# Patient Record
Sex: Female | Born: 1945 | Race: White | Hispanic: No | Smoking: Never smoker
Health system: Southern US, Community
[De-identification: ages and names within clinical notes are randomized; demographics above are authoritative.]

## PROBLEM LIST (undated history)

## (undated) DIAGNOSIS — I10 Essential (primary) hypertension: Secondary | ICD-10-CM

## (undated) DIAGNOSIS — G4700 Insomnia, unspecified: Secondary | ICD-10-CM

## (undated) DIAGNOSIS — R253 Fasciculation: Secondary | ICD-10-CM

## (undated) DIAGNOSIS — E559 Vitamin D deficiency, unspecified: Secondary | ICD-10-CM

## (undated) DIAGNOSIS — M5430 Sciatica, unspecified side: Secondary | ICD-10-CM

## (undated) DIAGNOSIS — M8580 Other specified disorders of bone density and structure, unspecified site: Secondary | ICD-10-CM

## (undated) DIAGNOSIS — K589 Irritable bowel syndrome without diarrhea: Secondary | ICD-10-CM

## (undated) DIAGNOSIS — K760 Fatty (change of) liver, not elsewhere classified: Secondary | ICD-10-CM

## (undated) DIAGNOSIS — E785 Hyperlipidemia, unspecified: Secondary | ICD-10-CM

## (undated) DIAGNOSIS — F419 Anxiety disorder, unspecified: Secondary | ICD-10-CM

## (undated) HISTORY — PX: CYST EXCISION: SHX5701

## (undated) HISTORY — DX: Irritable bowel syndrome without diarrhea: K58.9

## (undated) HISTORY — DX: Other specified disorders of bone density and structure, unspecified site: M85.80

## (undated) HISTORY — DX: Insomnia, unspecified: G47.00

## (undated) HISTORY — DX: Fatty (change of) liver, not elsewhere classified: K76.0

## (undated) HISTORY — DX: Anxiety disorder, unspecified: F41.9

## (undated) HISTORY — DX: Sciatica, unspecified side: M54.30

## (undated) HISTORY — PX: ABDOMINAL HYSTERECTOMY: SHX81

## (undated) HISTORY — DX: Vitamin D deficiency, unspecified: E55.9

## (undated) HISTORY — DX: Hyperlipidemia, unspecified: E78.5

## (undated) HISTORY — PX: CHOLECYSTECTOMY: SHX55

## (undated) HISTORY — DX: Fasciculation: R25.3

---

## 2000-02-26 ENCOUNTER — Inpatient Hospital Stay (HOSPITAL_COMMUNITY): Admission: AD | Admit: 2000-02-26 | Discharge: 2000-02-27 | Payer: Self-pay | Admitting: Cardiology

## 2000-02-27 ENCOUNTER — Encounter: Payer: Self-pay | Admitting: Cardiology

## 2000-12-21 ENCOUNTER — Ambulatory Visit (HOSPITAL_COMMUNITY): Admission: RE | Admit: 2000-12-21 | Discharge: 2000-12-21 | Payer: Self-pay | Admitting: Internal Medicine

## 2001-01-04 ENCOUNTER — Ambulatory Visit (HOSPITAL_COMMUNITY): Admission: RE | Admit: 2001-01-04 | Discharge: 2001-01-04 | Payer: Self-pay | Admitting: Family Medicine

## 2001-01-04 ENCOUNTER — Encounter: Payer: Self-pay | Admitting: Family Medicine

## 2001-01-10 ENCOUNTER — Encounter: Payer: Self-pay | Admitting: Family Medicine

## 2001-01-10 ENCOUNTER — Ambulatory Visit (HOSPITAL_COMMUNITY): Admission: RE | Admit: 2001-01-10 | Discharge: 2001-01-10 | Payer: Self-pay | Admitting: Family Medicine

## 2001-03-18 ENCOUNTER — Other Ambulatory Visit: Admission: RE | Admit: 2001-03-18 | Discharge: 2001-03-18 | Payer: Self-pay | Admitting: Obstetrics and Gynecology

## 2002-01-04 ENCOUNTER — Encounter: Payer: Self-pay | Admitting: Family Medicine

## 2002-01-04 ENCOUNTER — Ambulatory Visit (HOSPITAL_COMMUNITY): Admission: RE | Admit: 2002-01-04 | Discharge: 2002-01-04 | Payer: Self-pay | Admitting: Family Medicine

## 2002-08-07 ENCOUNTER — Ambulatory Visit (HOSPITAL_COMMUNITY): Admission: RE | Admit: 2002-08-07 | Discharge: 2002-08-07 | Payer: Self-pay | Admitting: Family Medicine

## 2002-08-07 ENCOUNTER — Encounter: Payer: Self-pay | Admitting: Family Medicine

## 2002-11-01 ENCOUNTER — Other Ambulatory Visit: Admission: RE | Admit: 2002-11-01 | Discharge: 2002-11-01 | Payer: Self-pay | Admitting: Dermatology

## 2009-01-26 ENCOUNTER — Inpatient Hospital Stay (HOSPITAL_COMMUNITY): Admission: EM | Admit: 2009-01-26 | Discharge: 2009-01-31 | Payer: Self-pay | Admitting: Emergency Medicine

## 2009-08-13 ENCOUNTER — Ambulatory Visit (HOSPITAL_COMMUNITY): Admission: RE | Admit: 2009-08-13 | Discharge: 2009-08-13 | Payer: Self-pay | Admitting: Family Medicine

## 2009-08-22 ENCOUNTER — Ambulatory Visit: Payer: Self-pay | Admitting: Gastroenterology

## 2009-08-22 DIAGNOSIS — K649 Unspecified hemorrhoids: Secondary | ICD-10-CM | POA: Insufficient documentation

## 2009-08-22 DIAGNOSIS — K5730 Diverticulosis of large intestine without perforation or abscess without bleeding: Secondary | ICD-10-CM | POA: Insufficient documentation

## 2009-08-22 DIAGNOSIS — K5732 Diverticulitis of large intestine without perforation or abscess without bleeding: Secondary | ICD-10-CM | POA: Insufficient documentation

## 2009-08-22 DIAGNOSIS — D649 Anemia, unspecified: Secondary | ICD-10-CM | POA: Insufficient documentation

## 2009-09-02 ENCOUNTER — Telehealth (INDEPENDENT_AMBULATORY_CARE_PROVIDER_SITE_OTHER): Payer: Self-pay

## 2010-06-08 ENCOUNTER — Encounter: Payer: Self-pay | Admitting: Family Medicine

## 2010-06-19 NOTE — Assessment & Plan Note (Signed)
Summary: PT HAS A H/O DIVERTICULITIS,ANEMIA AND WANT CONSULT FOR TCS/CM   Visit Type:  Initial Visit Primary Care Provider:  Sudie Bailey  Chief Complaint:  Hx diverticulitis.  History of Present Illness: Claire Weaver is a pleasant 65 y/o WF, patient of Dr. Sudie Bailey, who presents for further evalution of diverticulitis. She says she has multiple mild episodes of left sided abdominal tenderness associated with altered BM throughout the years. She had TCS over 10 years ago with Dr. Karilyn Cota, no polyps. She takes fiber daily. Back in September she was hospitalized at [facility] for the first time due to uncomplicated diverticultitis. She had normal H/H on admission but Hgb dropped to mid 10 range with dilution. She has not had her Hgb checked since then.   She has regular BMs, ranging from firm to mushy. She has noted fecal soilage for two months. Denies melena, brbpr. C/O intermittent bloating and gas. Stress, spicey foods, tomotoes triggers her left sided abdominal pain. She c/o anorectal itching and burning related to hemorrhoids.          Current Medications (verified): 1)  Enalapril Maleate 10 Mg Tabs (Enalapril Maleate) .... Take 1 Tablet By Mouth Once A Day 2)  Januvia 100 Mg Tabs (Sitagliptin Phosphate) .... 1/4 Tablet Daily 3)  Asa 81 Mg .... Take 1 Tablet By Mouth Once A Day 4)  Zetia 10 Mg Tabs (Ezetimibe) .... Take 1 Tablet By Mouth Once A Day 5)  Calcium 500 Mg Plus D .... Take 1 Tablet By Mouth Once A Day 6)  Fish Oil 500 Mg .... Take 1 Tablet By Mouth Once A Day 7)  Aleve .... As Needed  Allergies (verified): 1)  ! Bactrim 2)  ! * Shell Fish  Past History:  Past Medical History: Sigmoid diverticulitis, hospitalized in 9/10 Normocytic anemia, during hospitalization 9/10, ?dilutional Diabetes mellitus  Hypertension.  Hyperlipidemia  Past Surgical History: Hysterectomy, partial  Cholecystectomy  Family History:  No history of colon cancer.  Positive history of   diverticulosis and colon polyps (mother).      Social History: Divorced, lives with her friend.  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  Denies any smoking or alcohol.  Review of Systems General:  Denies fever, chills, sweats, anorexia, fatigue, weakness, and weight loss. Eyes:  Denies vision loss. ENT:  Denies nasal congestion, hoarseness, and difficulty swallowing. CV:  Denies chest pains, angina, palpitations, dyspnea on exertion, and peripheral edema. Resp:  Denies dyspnea at rest, dyspnea with exercise, and cough. GI:  See HPI. GU:  Denies urinary burning and blood in urine. MS:  Complains of joint pain / LOM. Derm:  Denies rash and itching. Neuro:  Denies weakness, difficulty walking, memory loss, and confusion. Psych:  Denies depression and anxiety. Endo:  Denies unusual weight change. Heme:  Denies bruising and bleeding. Allergy:  Denies hives and rash.  Vital Signs:  Patient profile:   65 year old female Height:      65 inches Weight:      185 pounds BMI:     30.90 Temp:     98.7 degrees F oral Pulse rate:   80 / minute BP sitting:   132 / 78  (left arm) Cuff size:   large  Vitals Entered By: Claire Spring LPN (August 23, 979 2:13 PM)  Physical Exam  General:  Well developed, well nourished, no acute distress. Head:  Normocephalic and atraumatic. Eyes:  Conjunctivae pink, no scleral icterus.  Mouth:  Oropharyngeal mucosa moist, pink.  No lesions,  erythema or exudate.    Neck:  Supple; no masses or thyromegaly. Lungs:  Clear throughout to auscultation. Heart:  Regular rate and rhythm; no murmurs, rubs,  or bruits. Abdomen:  Soft. Normal bowel sounds. Mild left-sided abd tenderness without rebound or guarding. No HSM or masses. No abd bruit or hernia. Rectal:  deferred until time of colonoscopy.   Extremities:  No clubbing, cyanosis, edema or deformities noted. Neurologic:  Alert and  oriented x4;  grossly normal neurologically. Skin:  Intact without significant lesions or  rashes. Cervical Nodes:  No significant cervical adenopathy. Psych:  Alert and cooperative. Normal mood and affect.  Impression & Recommendations:  Problem # 1:  DIVERTICULOSIS OF COLON (ICD-562.10)  Hospitalized 9/10 with diverticulitis. Most "flares" she treated with change in diet and did not require Abx. She continues to have intermittent mild left-sided abdominal pain with soft/loose stool worse with certain foods and stress. ?underlying IBS too. Last TCS over ten yrs ago. FH of colon polyps. Colonoscopy to be performed in near future.  Risks, alternatives, and benefits including but not limited to the risk of reaction to medication, bleeding, infection, and perforation were addressed.  Patient voiced understanding and provided verbal consent. Hold ASA five days. Hold Januvia day of prep.   Orders: New Patient Level III (02725)  Problem # 2:  HEMORRHOIDS (ICD-455.6)  Patient c/o anorectal itching and fecal soilage. Would like to have hemorrhoids banded if possible. Has failed numberous topical regimens. Advised that Dr. Darrick Penna will examine at time of TCS and decide about options of banding.   Orders: New Patient Level III (36644)     Appended Document: PT HAS A H/O DIVERTICULITIS,ANEMIA AND WANT CONSULT FOR TCS/CM Pt needs 1 hour slot for TCS/? HEMORRHOIDAL BANDING.  Appended Document: PT HAS A H/O DIVERTICULITIS,ANEMIA AND WANT CONSULT FOR TCS/CM Pt is in a 1 hour slot.

## 2010-06-19 NOTE — Progress Notes (Signed)
Summary: phone note/ Miralax prep not started working  Phone Note Call from Patient   Caller: Patient Summary of Call: Pt said she started her Miralax prep @ 11:00 this AM and said it hasn't done anything yet. She wants to know if she needs to  take Mag Citrate or something else. She can be reached at [phone] her cell phone. Please advise Initial call taken by: Cloria Spring LPN,  September 02, 2009 2:45 PM     Appended Document: phone note/ Miralax prep not started working Please call pt. She should take 17 gm Miralax in 8 oz liquid every hour from 4p-12MN. She should add additional 8 oz liquid every half hour beginning 430p and end at 1130p.  Appended Document: phone note/ Miralax prep not started working [facility] at home and on cell.  Appended Document: phone note/ Miralax prep not started working Pt said she is hurting so bad, said she is having another flare with her diverticulitis, on cipro. Said to just cancel for tomorrow and she will talk to Dr. Darrick Penna, or call back later and reschedule. She just can't continue now. She can be reached at work at [phone].  Appended Document: phone note/ Miralax prep not started working Informed Kim and removed from IDX.

## 2010-06-19 NOTE — Letter (Signed)
Summary: TRCS ORDER  TRCS ORDER   Imported By: Ave Filter 08/22/2009 15:27:19  _____________________________________________________________________  External Attachment:    Type:   Image     Comment:   External Document

## 2010-07-04 ENCOUNTER — Observation Stay (HOSPITAL_COMMUNITY)
Admission: RE | Admit: 2010-07-04 | Discharge: 2010-07-05 | DRG: 313 | Disposition: A | Discharge status: Home / Self Care | Payer: Self-pay | Source: Other Acute Inpatient Hospital | Attending: Cardiovascular Disease | Admitting: Cardiovascular Disease

## 2010-07-04 ENCOUNTER — Emergency Department (HOSPITAL_COMMUNITY): Payer: Self-pay

## 2010-07-04 ENCOUNTER — Emergency Department (HOSPITAL_COMMUNITY)
Admission: EM | Admit: 2010-07-04 | Discharge: 2010-07-04 | Disposition: A | Discharge status: Other Acute Inpatient Hospital | Payer: Self-pay | Attending: Emergency Medicine | Admitting: Emergency Medicine

## 2010-07-04 DIAGNOSIS — E785 Hyperlipidemia, unspecified: Secondary | ICD-10-CM | POA: Insufficient documentation

## 2010-07-04 DIAGNOSIS — E119 Type 2 diabetes mellitus without complications: Secondary | ICD-10-CM | POA: Insufficient documentation

## 2010-07-04 DIAGNOSIS — R079 Chest pain, unspecified: Principal | ICD-10-CM | POA: Insufficient documentation

## 2010-07-04 DIAGNOSIS — I10 Essential (primary) hypertension: Secondary | ICD-10-CM | POA: Insufficient documentation

## 2010-07-04 DIAGNOSIS — Z7982 Long term (current) use of aspirin: Secondary | ICD-10-CM | POA: Insufficient documentation

## 2010-07-04 DIAGNOSIS — Z79899 Other long term (current) drug therapy: Secondary | ICD-10-CM | POA: Insufficient documentation

## 2010-07-04 LAB — DIFFERENTIAL
Basophils Absolute: 0 10*3/uL (ref 0.0–0.1)
Basophils Relative: 0 % (ref 0–1)
Eosinophils Relative: 1 % (ref 0–5)
Lymphocytes Relative: 31 % (ref 12–46)
Lymphs Abs: 1.4 10*3/uL (ref 0.7–4.0)
Monocytes Absolute: 0.4 10*3/uL (ref 0.1–1.0)
Monocytes Relative: 9 % (ref 3–12)
Neutro Abs: 2.8 10*3/uL (ref 1.7–7.7)

## 2010-07-04 LAB — COMPREHENSIVE METABOLIC PANEL
ALT: 22 U/L (ref 0–35)
Albumin: 3.7 g/dL (ref 3.5–5.2)
Alkaline Phosphatase: 60 U/L (ref 39–117)
BUN: 17 mg/dL (ref 6–23)
CO2: 26 mEq/L (ref 19–32)
Calcium: 9 mg/dL (ref 8.4–10.5)
Chloride: 106 mEq/L (ref 96–112)
Creatinine, Ser: 0.73 mg/dL (ref 0.4–1.2)
GFR calc Af Amer: >60 mL/min (ref >60)
GFR calc non Af Amer: >60 mL/min (ref >60)
Sodium: 141 mEq/L (ref 135–145)
Total Bilirubin: 0.5 mg/dL (ref 0.3–1.2)
Total Protein: 6.5 g/dL (ref 6.0–8.3)

## 2010-07-04 LAB — BASIC METABOLIC PANEL
BUN: 22 mg/dL (ref 6–23)
CO2: 24 mEq/L (ref 19–32)
Chloride: 104 mEq/L (ref 96–112)
Creatinine, Ser: 0.92 mg/dL (ref 0.4–1.2)
GFR calc Af Amer: >60 mL/min (ref >60)
Glucose, Bld: 188 mg/dL — ABNORMAL HIGH (ref 70–99)
Potassium: 3.8 mEq/L (ref 3.5–5.1)
Sodium: 139 mEq/L (ref 135–145)

## 2010-07-04 LAB — CBC
HCT: 38 % (ref 36.0–46.0)
Hemoglobin: 12.6 g/dL (ref 12.0–15.0)
MCH: 29 pg (ref 26.0–34.0)
MCHC: 33.2 g/dL (ref 30.0–36.0)
MCV: 87.4 fL (ref 78.0–100.0)
Platelets: 192 10*3/uL (ref 150–400)
RDW: 12.1 % (ref 11.5–15.5)
WBC: 4.7 10*3/uL (ref 4.0–10.5)

## 2010-07-04 LAB — POCT CARDIAC MARKERS
CKMB, poc: <1 ng/mL — ABNORMAL LOW (ref 1.0–8.0)
CKMB, poc: <1 ng/mL — ABNORMAL LOW (ref 1.0–8.0)
Myoglobin, poc: 68.4 ng/mL (ref 12–200)
Myoglobin, poc: 49.6 ng/mL (ref 12–200)
Troponin i, poc: <0.05 ng/mL (ref 0.00–0.09)
Troponin i, poc: <0.05 ng/mL (ref 0.00–0.09)

## 2010-07-04 LAB — CARDIAC PANEL(CRET KIN+CKTOT+MB+TROPI)
CK, MB: 1 ng/mL (ref 0.3–4.0)
Relative Index: INVALID (ref 0.0–2.5)
Total CK: 87 U/L (ref 7–177)
Troponin I: 0.01 ng/mL (ref 0.00–0.06)

## 2010-07-04 LAB — GLUCOSE, CAPILLARY

## 2010-07-04 LAB — LIPASE, BLOOD: Lipase: 22 U/L (ref 11–59)

## 2010-07-04 LAB — D-DIMER, QUANTITATIVE: D-Dimer, Quant: 0.23 ug/mL-FEU (ref 0.00–0.48)

## 2010-07-04 LAB — PROTIME-INR: INR: 0.93 (ref 0.00–1.49)

## 2010-07-05 ENCOUNTER — Inpatient Hospital Stay (HOSPITAL_COMMUNITY): Payer: Self-pay

## 2010-07-05 DIAGNOSIS — R079 Chest pain, unspecified: Secondary | ICD-10-CM

## 2010-07-05 LAB — CARDIAC PANEL(CRET KIN+CKTOT+MB+TROPI)
CK, MB: 0.9 ng/mL (ref 0.3–4.0)
Relative Index: INVALID (ref 0.0–2.5)
Total CK: 80 U/L (ref 7–177)
Troponin I: 0.01 ng/mL (ref 0.00–0.06)
Troponin I: 0.01 ng/mL (ref 0.00–0.06)

## 2010-07-05 LAB — GLUCOSE, CAPILLARY: Glucose-Capillary: 145 mg/dL — ABNORMAL HIGH (ref 70–99)

## 2010-07-05 LAB — TSH: TSH: 1.45 u[IU]/mL (ref 0.350–4.500)

## 2010-07-05 LAB — HEPATIC FUNCTION PANEL
ALT: 18 U/L (ref 0–35)
Albumin: 3.4 g/dL — ABNORMAL LOW (ref 3.5–5.2)
Alkaline Phosphatase: 54 U/L (ref 39–117)
Total Bilirubin: 0.3 mg/dL (ref 0.3–1.2)
Total Protein: 5.8 g/dL — ABNORMAL LOW (ref 6.0–8.3)

## 2010-07-05 LAB — LIPID PANEL
Cholesterol: 176 mg/dL (ref 0–200)
HDL: 33 mg/dL — ABNORMAL LOW (ref >39)
Total CHOL/HDL Ratio: 5.3 RATIO

## 2010-07-05 MED ORDER — TECHNETIUM TC 99M TETROFOSMIN IV KIT
30.0000 | PACK | Freq: Once | INTRAVENOUS | Status: AC | PRN
Start: 2010-07-05 — End: 2010-07-05
  Administered 2010-07-05: 30 via INTRAVENOUS

## 2010-07-05 MED ORDER — TECHNETIUM TC 99M TETROFOSMIN IV KIT
10.0000 | PACK | Freq: Once | INTRAVENOUS | Status: AC | PRN
  Administered 2010-07-05: 10 via INTRAVENOUS

## 2010-07-06 ENCOUNTER — Other Ambulatory Visit (HOSPITAL_COMMUNITY): Payer: Self-pay

## 2010-07-08 ENCOUNTER — Inpatient Hospital Stay (HOSPITAL_COMMUNITY)
Admission: AD | Admit: 2010-07-08 | Discharge: 2010-07-10 | DRG: 287 | Disposition: A | Discharge status: Home / Self Care | Payer: Self-pay | Source: Ambulatory Visit | Attending: Cardiology | Admitting: Cardiology

## 2010-07-08 DIAGNOSIS — E119 Type 2 diabetes mellitus without complications: Secondary | ICD-10-CM | POA: Diagnosis present

## 2010-07-08 DIAGNOSIS — Z881 Allergy status to other antibiotic agents status: Secondary | ICD-10-CM

## 2010-07-08 DIAGNOSIS — F411 Generalized anxiety disorder: Secondary | ICD-10-CM | POA: Diagnosis present

## 2010-07-08 DIAGNOSIS — R0789 Other chest pain: Principal | ICD-10-CM | POA: Diagnosis present

## 2010-07-08 DIAGNOSIS — Z882 Allergy status to sulfonamides status: Secondary | ICD-10-CM

## 2010-07-08 DIAGNOSIS — Z7982 Long term (current) use of aspirin: Secondary | ICD-10-CM

## 2010-07-08 DIAGNOSIS — I10 Essential (primary) hypertension: Secondary | ICD-10-CM | POA: Diagnosis present

## 2010-07-08 DIAGNOSIS — E785 Hyperlipidemia, unspecified: Secondary | ICD-10-CM | POA: Diagnosis present

## 2010-07-08 DIAGNOSIS — Z91013 Allergy to seafood: Secondary | ICD-10-CM

## 2010-07-08 DIAGNOSIS — R079 Chest pain, unspecified: Secondary | ICD-10-CM

## 2010-07-08 HISTORY — DX: Essential (primary) hypertension: I10

## 2010-07-08 LAB — COMPREHENSIVE METABOLIC PANEL
ALT: 19 U/L (ref 0–35)
AST: 26 U/L (ref 0–37)
Albumin: 4 g/dL (ref 3.5–5.2)
CO2: 25 mEq/L (ref 19–32)
Chloride: 99 mEq/L (ref 96–112)
GFR calc Af Amer: >60 mL/min (ref >60)
GFR calc non Af Amer: 53 mL/min — ABNORMAL LOW (ref >60)
Glucose, Bld: 175 mg/dL — ABNORMAL HIGH (ref 70–99)
Potassium: 3.7 mEq/L (ref 3.5–5.1)
Sodium: 136 mEq/L (ref 135–145)
Total Bilirubin: 0.5 mg/dL (ref 0.3–1.2)
Total Protein: 6.5 g/dL (ref 6.0–8.3)

## 2010-07-08 LAB — GLUCOSE, CAPILLARY
Glucose-Capillary: 177 mg/dL — ABNORMAL HIGH (ref 70–99)
Glucose-Capillary: 134 mg/dL — ABNORMAL HIGH (ref 70–99)

## 2010-07-08 LAB — CBC
HCT: 36.2 % (ref 36.0–46.0)
Hemoglobin: 12 g/dL (ref 12.0–15.0)
MCH: 29.9 pg (ref 26.0–34.0)
MCHC: 33.1 g/dL (ref 30.0–36.0)
MCV: 90 fL (ref 78.0–100.0)
Platelets: 210 10*3/uL (ref 150–400)
RBC: 4.02 MIL/uL (ref 3.87–5.11)
RDW: 12.2 % (ref 11.5–15.5)
WBC: 6.4 10*3/uL (ref 4.0–10.5)

## 2010-07-08 LAB — PROTIME-INR
INR: 0.97 (ref 0.00–1.49)
Prothrombin Time: 13.1 seconds (ref 11.6–15.2)

## 2010-07-08 LAB — DIFFERENTIAL
Basophils Absolute: 0 10*3/uL (ref 0.0–0.1)
Basophils Relative: 1 % (ref 0–1)
Eosinophils Absolute: 0 10*3/uL (ref 0.0–0.7)
Eosinophils Relative: 1 % (ref 0–5)
Lymphs Abs: 2.4 10*3/uL (ref 0.7–4.0)
Monocytes Absolute: 0.5 10*3/uL (ref 0.1–1.0)
Monocytes Relative: 8 % (ref 3–12)

## 2010-07-08 LAB — CARDIAC PANEL(CRET KIN+CKTOT+MB+TROPI)
CK, MB: 1 ng/mL (ref 0.3–4.0)
CK, MB: 1.1 ng/mL (ref 0.3–4.0)
Relative Index: INVALID (ref 0.0–2.5)
Total CK: 90 U/L (ref 7–177)
Troponin I: 0.04 ng/mL (ref 0.00–0.06)

## 2010-07-08 LAB — AMYLASE: Amylase: 37 U/L (ref 0–105)

## 2010-07-08 LAB — LIPASE, BLOOD: Lipase: 26 U/L (ref 11–59)

## 2010-07-09 ENCOUNTER — Encounter (HOSPITAL_COMMUNITY): Payer: Self-pay

## 2010-07-09 ENCOUNTER — Inpatient Hospital Stay (HOSPITAL_COMMUNITY): Payer: Self-pay

## 2010-07-09 LAB — CBC
HCT: 35.4 % — ABNORMAL LOW (ref 36.0–46.0)
Hemoglobin: 11.7 g/dL — ABNORMAL LOW (ref 12.0–15.0)
MCH: 29.8 pg (ref 26.0–34.0)
MCHC: 33.1 g/dL (ref 30.0–36.0)
MCV: 90.1 fL (ref 78.0–100.0)
Platelets: 185 10*3/uL (ref 150–400)
RBC: 3.93 MIL/uL (ref 3.87–5.11)
RDW: 12.3 % (ref 11.5–15.5)
WBC: 5.2 10*3/uL (ref 4.0–10.5)

## 2010-07-09 LAB — GLUCOSE, CAPILLARY
Glucose-Capillary: 144 mg/dL — ABNORMAL HIGH (ref 70–99)
Glucose-Capillary: 143 mg/dL — ABNORMAL HIGH (ref 70–99)
Glucose-Capillary: 151 mg/dL — ABNORMAL HIGH (ref 70–99)
Glucose-Capillary: 163 mg/dL — ABNORMAL HIGH (ref 70–99)
Glucose-Capillary: 161 mg/dL — ABNORMAL HIGH (ref 70–99)

## 2010-07-09 LAB — BASIC METABOLIC PANEL
BUN: 17 mg/dL (ref 6–23)
CO2: 26 mEq/L (ref 19–32)
Calcium: 8.6 mg/dL (ref 8.4–10.5)
Chloride: 105 mEq/L (ref 96–112)
GFR calc Af Amer: >60 mL/min (ref >60)
GFR calc non Af Amer: >60 mL/min (ref >60)
Glucose, Bld: 161 mg/dL — ABNORMAL HIGH (ref 70–99)
Potassium: 3.7 mEq/L (ref 3.5–5.1)
Sodium: 139 mEq/L (ref 135–145)

## 2010-07-09 LAB — HEPARIN LEVEL (UNFRACTIONATED)
Heparin Unfractionated: 0.1 IU/mL — ABNORMAL LOW (ref 0.30–0.70)
Heparin Unfractionated: 0.76 IU/mL — ABNORMAL HIGH (ref 0.30–0.70)

## 2010-07-09 LAB — CARDIAC PANEL(CRET KIN+CKTOT+MB+TROPI)
CK, MB: 1 ng/mL (ref 0.3–4.0)
Total CK: 82 U/L (ref 7–177)
Troponin I: 0.03 ng/mL (ref 0.00–0.06)

## 2010-07-09 MED ORDER — IOHEXOL 300 MG/ML  SOLN
80.0000 mL | Freq: Once | INTRAMUSCULAR | Status: AC | PRN
  Administered 2010-07-09: 80 mL via INTRAVENOUS

## 2010-07-10 LAB — CBC
HCT: 35.4 % — ABNORMAL LOW (ref 36.0–46.0)
Hemoglobin: 11.8 g/dL — ABNORMAL LOW (ref 12.0–15.0)
MCH: 29.9 pg (ref 26.0–34.0)
MCHC: 33.3 g/dL (ref 30.0–36.0)
MCV: 89.6 fL (ref 78.0–100.0)
Platelets: 190 10*3/uL (ref 150–400)
RDW: 12.2 % (ref 11.5–15.5)
WBC: 4.8 10*3/uL (ref 4.0–10.5)

## 2010-07-10 NOTE — Procedures (Signed)
  Claire Weaver, Claire Weaver               ACCOUNT NO.:  [id number]  MEDICAL RECORD NO.:  [id number]           PATIENT TYPE:  I  LOCATION:  6523                         FACILITY:  [facility]  PHYSICIAN:  Verne Carrow, MDDATE OF BIRTH:  02-14-1946  DATE OF PROCEDURE:  07/09/2010 DATE OF DISCHARGE:                           CARDIAC CATHETERIZATION   PRIMARY CARE PHYSICIAN:  Mila Homer. Sudie Bailey, MD  PRIMARY CARDIOLOGIST:  Gerrit Friends. Dietrich Pates, MD, [facility]  PROCEDURES PERFORMED: 1. Left heart catheterization. 2. Selective coronary angiography. 3. Left ventricular angiogram.  OPERATOR:  Verne Carrow, MD  INDICATIONS:  Chest pain.  PROCEDURE IN DETAIL:  The patient was brought to the cardiac catheterization laboratory after signing informed consent for the procedure.  The patient was placed supine on the cath table.  The right wrist was assessed with an Freida Busman test which was positive.  The right wrist was prepped and draped in sterile fashion.  Lidocaine 1% was used for local anesthesia.  A 5-French sheath was inserted into the right radial artery without difficulty.  Verapamil 3 mg was given through the sheath.  The patient was given 4000 units of heparin.  Standard diagnostic catheters were used to perform selective coronary angiography.  A pigtail catheter was used to perform a left ventricular angiogram.  The patient tolerated the procedure well.  The sheath was removed from the right radial artery and a Terumo hemostasis band was applied over the arteriotomy site.  The patient was taken to the holding area in stable condition.  HEMODYNAMIC FINDINGS:  Central aortic pressure 126/66.  Left ventricular pressure 121/11.  Left ventricular end-diastolic pressure 17.  ANGIOGRAPHIC FINDINGS:  The left main coronary artery bifurcated into the circumflex and the left anterior descending arteries that were free of disease.  The right coronary artery was a large dominant vessel  that was free of disease.  The left ventricular angiogram showed normal left ventricular systolic function with ejection fraction of 55%.  IMPRESSION: 1. No angiographic evidence of coronary artery disease. 2. Normal left ventricular systolic function. 3. Noncardiac chest pain.  RECOMMENDATIONS:  No further ischemic workup at this time.     Verne Carrow, MD     CM/MEDQ  D:  07/09/2010  T:  07/09/2010  Job:  [id number]  cc:   Mila Homer. Sudie Bailey, M.D. Gerrit Friends. Dietrich Pates, MD, [facility]  Electronically Signed by Verne Carrow MD on 07/10/2010 02:16:30 PM

## 2010-07-11 NOTE — H&P (Addendum)
Claire Weaver, Claire Weaver               ACCOUNT NO.:  [id number]  MEDICAL RECORD NO.:  [id number]           PATIENT TYPE:  I  LOCATION:  2012                         FACILITY:  [facility]  PHYSICIAN:  Madolyn Frieze. Jens Som, MD, FACCDATE OF BIRTH:  1945-08-17  DATE OF ADMISSION:  07/08/2010 DATE OF DISCHARGE:                             HISTORY & PHYSICAL   PRIMARY CARDIOLOGIST:  The patient was new to [place], her admission on July 04, 2010 through July 05, 2010, will follow up with Dr. Dietrich Pates.  PRIMARY MEDICAL DOCTOR:  Mila Homer. Sudie Bailey, MD  CHIEF COMPLAINT:  Chest pain.  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  She has a history of hypertension, hyperlipidemia and controlled diabetes.  During that hospital stay, she had a negative D- dimer, negative cardiac enzymes, and negative nuclear Myoview study as well as negative lipase and normal LFTs.  X-ray of the abdomen and chest were negative as well.  She was readmitted with complaints of chest pain.  Last night, she developed some chest pressure around 1 a.m. that kept her awake.  It was the same pain she has experienced before possibly associated with some neck discomfort.  The pain went straight to her back and has not gone away since.  She went to work today to try to get her mind off it.  She has been mentioned the pain to Dr. Sudie Bailey and to do its persistence, she was advised for further evaluation.  She has a direct admission to [facility] today.  She denies any dizziness or palpitations whatsoever.  With her episode prompting admission on July 04, 2010, she had had nausea, vomiting, presyncope and has decreased blood pressure, but has had  none of those associated symptoms with this particular episode.  EKG is unremarkable with no  acute distress.  PAST MEDICAL HISTORY: 1. Type 2 diabetes, controlled, non-insulin-dependent, A1c of 6.2%. 2. Controlled hypertension. 3. Dyslipidemia with total cholesterol 176, triglycerides 333, HDL 33,     LDL 76 on July 05, 2010. 4. Anxiety. 5. Diverticulitis. 6. Chest pain, with evaluation on July 04, 2010 to July 05, 2010 with negative cardiac enzymes, negative D-dimer, negative     Myoview stress test with normal EF, negative lipase.  LFTs are     within normal limits, and negative plain films of the abdomen and     chest.  PAST SURGICAL HISTORY:  Cholecystectomy and partial hysterectomy.  MEDICATIONS: 1. Aspirin 81 mg daily. 2. Enalapril 20 mg daily. 3. Biotin. 4. Over-the-counter hydrochlorothiazide 25 mg daily. 5. Metformin 500 mg daily. 6. Omega-3 1 g daily. 7. Xanax 1 mg one tablet every morning, afternoon and half tablet     nightly.  ALLERGIES:  BACTRIM, SHELLFISH and SULFA, although the patient has tolerated contrast dye in the past without difficulty or premedication.  SOCIAL HISTORY:  Ms. Gellerman lives in [place].  She denies any history of tobacco, alcohol, or drug use.  She has  had increased stress with her son lately.  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  FAMILY HISTORY:  Her brother had a CAD diagnosis in mid to late 5s.  REVIEW OF SYSTEMS:  No fevers, chills, cough, nausea, vomiting, diarrhea, dysphagia, bright red blood per rectum, melena, hematemesis or hematuria.  All other systems reviewed and otherwise negative except for those noted in the HPI.  LABORATORY DATA:  No labs this admission just yet, but TSH, lipase, LFTs and cardiac enzymes are within normal limits last admission.  RADIOLOGY:  None yet.  EKG:  Normal sinus rhythm with LVH.  PHYSICAL EXAMINATION:  VITAL SIGNS:  Temperature 98, pulse 80, respirations 20, blood pressure 136/80, pulse ox is 96% on room air. GENERAL:  This is a pleasant  well-appearing white female in no acute distress. HEENT:  Normocephalic and atraumatic with extraocular movements intact. Clear sclerae.  Nares without discharge. NECK:  Supple without carotid bruits. HEART:  Auscultation of the heart reveals regular rate and rhythm with S1 and S2 without murmurs, rubs, or gallops. LUNGS:  Clear to auscultation bilaterally without wheezes, rales or rhonchi. ABDOMEN:  Soft, nontender, and nondistended with positive bowel sounds. EXTREMITIES:  Warm and dry without edema.  She has 2+ pedal pulses bilaterally. NEUROLOGIC:  She is alert and oriented x3, responds to questions appropriately with a normal affect, but slightly worried appearing.  ASSESSMENT/PLAN:  The patient was seen and examined by Dr. Jens Som and myself.  This is a 65 year old female with a past medical history of includes diabetes, hypertension, hyperlipidemia who was admitted for recurrence of chest pain following admission that included negative stress test, LFTs, lipase, D-dimer and cardiac enzymes.  The patient had more chest pain today starting approximately 1 o'clock in the morning radiating to her back.  There have been no associated symptoms and the pain is not exertional.  Some question of whether or not has increased with inspiration.  The pain persisted and the physician in our office Dr. Sudie Bailey discussed with the case with Dr. Myrtis Ser.  The patient was referred for catheterization to define her anatomy.  Risks versus benefits were discussed with the patient who agrees to proceed.  There is initial consideration of aortic dissection, but given the chronicity of these symptoms over 9 months which makes it much less likely.  Note that the patient has history of shellfish allergy, but she has tolerated contrast dye without reaction in the past.  Further recommendations to follow depending on the patient's workup.     Marquetta Weiskopf, P.A.C.   ______________________________ Madolyn Frieze. Jens Som, MD, [facility]    DD/MEDQ  D:  07/08/2010  T:  07/09/2010  Job:  [id number]  cc:   Mila Homer. Sudie Bailey, M.D. Gerrit Friends. Dietrich Pates, MD, [facility]  Electronically Signed by Olga Millers MD [facility] on 07/11/2010 06:52:38 PM Electronically Signed by Ronie Spies  on 07/16/2010 12:57:32 PM

## 2010-07-15 NOTE — Discharge Summary (Signed)
Claire Weaver, Claire Weaver NO.:  [id number]  MEDICAL RECORD NO.:  [id number]           PATIENT TYPE:  I  LOCATION:  6523                         FACILITY:  [facility]  PHYSICIAN:  Luis Abed, MD, FACCDATE OF BIRTH:  01-21-1946  DATE OF ADMISSION:  07/08/2010 DATE OF DISCHARGE:  07/10/2010                              DISCHARGE SUMMARY   PRIMARY CARE PHYSICIAN:  Mila Homer. Sudie Bailey, MD  New to Cardiology.  DISCHARGE DIAGNOSIS:  Noncardiac chest pain. A.  Cardiac catheterization on July 09, 2010:  No angiographic evidence of coronary artery disease.  Normal left ventricular systolic function, left ventricular ejection fraction 55%. B.  CT angiography of the chest on July 09, 2010:  No evidence of acute aortic pathology or pulmonary thromboembolism. C.  Negative pharmacologic stress test on July 05, 2010.  SECONDARY DIAGNOSES: 1. Non-insulin-dependent diabetes mellitus (hemoglobin A1c 6.2%). 2. Hypertension, well controlled. 3. Dyslipidemia.     a.     Total cholesterol 176, triglycerides 333, HDL 33, LDL 76,      July 05, 2010. 4. Anxiety. 5. Diverticulitis.  PAST SURGICAL HISTORY: 1. Cholecystectomy. 2. Partial hysterectomy.  ALLERGIES AND INTOLERANCES: 1. SULFA (hives). 2. BACTRIM (hives).  PROCEDURES: 1. EKG on July 08, 2010:  NSR, LVH, no significant ST-T-wave     changes. 2. Cardiac catheterization on July 09, 2010:  Please see discharge     diagnoses section #1, subsection A. 3. CT angiography of the chest on July 09, 2010:  Please see     discharge diagnoses #1, subsection B.  HISTORY OF PRESENT ILLNESS:  Claire Weaver is a 65 year old female with the above-noted complex medical history who recently was worked up at [facility] after she developed an episode of severe nausea with presyncope while at work/at rest in the setting of recent exertional chest discomfort, mild.  She was admitted on July 04, 2010,  and eventually had a pharmacologic stress test that was negative as well as ruled out with serial negative cardiac enzymes.  Unfortunately, the patient had recurrence in chest discomfort at this time at approximately 1 a.m. and presented to [facility] on July 08, 2010, for further evaluation.  This patient reports her symptoms keeping her awake for a good portion in the evening prior to her presentation.  This was the same type of pain she had experienced before with radiation into the neck.  It also radiated into her back.  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  When evaluated by Cardiology, she denied palpitations, presyncope, lightheadedness/dizziness.  Her previous symptoms of nausea, vomiting, presyncope, and hypotension were absent from this episode.  HOSPITAL COURSE:  The patient was admitted and cardiac enzymes were cycled again ruling out for MI.  Given the recurrence of her symptoms, she was scheduled for diagnostic cardiac catheterization which was completed on the morning of July 09, 2010, and luckily did not show any angiographic evidence of coronary artery  disease.  She also had normal left ventricular systolic function.  She then had a CT angiography of the chest to rule out aortic dissection which again luckily was negative.  She was kept overnight for observation and then seen in the morning by attending cardiologist, Dr. Willa Rough.  He notes that CT did show hepatic steatosis but no other significant findings and thus given the significant symptoms was stable for discharge, okay to return to work on July 11, 2010.  The patient will follow up with Dr. John Giovanni in [place].  No changes were made to her preadmission medications.  At the time of her discharge,  the patient did receive her old med list unchanged, followup instructions, and postcath instructions.  All questions and concerns were addressed prior to her leaving the hospital.  DISCHARGE LABORATORY DATA:  WBC 4.8, HGB 11.8 HCT 35.4, PLT count is 190,000.  WBC differential on admission was within normal limits. Protime 13.1, INR 0.97.  Sodium 139, potassium 2.7, chloride 105, bicarb 26, BUN 17, creatinine 0.80, glucose ranged from 134-181 this admission. Liver function tests were within normal limits.  Albumin is 4.0, calcium 8.6, amylase 37, lipase 26.  Cardiac enzymes, three full sets, negative.  FOLLOWUP PLANS AND APPOINTMENTS:  Please see hospital course.  DISCHARGE MEDICATIONS: 1. Acetaminophen 325 mg 1-2 tablets p.o. q.4 h. 2. Enteric-coated aspirin 81 mg p.o. daily. 3. Biotin over-the-counter tablet p.o. daily. 4. Enalapril 20 mg 1 tablet daily. 5. Hydrochlorothiazide 25 mg 1 tablet p.o. daily. 6. Metformin 500 mg 1 tablet p.o. daily. 7. Omega-3 acid ethyl esters 1 g p.o. daily. 8. Xanax 1 mg one-half to 1 tablet p.o. t.i.d. 9. Nitrostat 0.4 mg one tablet sublingual q.5 minutes up to 2 doses     p.r.n. for chest discomfort.  Duration of discharge encounter including physician time was 35 minutes.     Jarrett Ables, PAC   ______________________________ Luis Abed, MD, [facility]    MS/MEDQ  D:  07/10/2010  T:  07/10/2010  Job:  [id number]  cc:   Mila Homer. Sudie Bailey, M.D.  Electronically Signed by Jarrett Ables PAC on 07/14/2010 02:55:57 PM Electronically Signed by Willa Rough MD FACC on 07/15/2010 08:38:04 PM

## 2010-07-21 ENCOUNTER — Encounter: Payer: Self-pay | Admitting: Adult Health

## 2010-08-06 NOTE — Discharge Summary (Signed)
  [name], [name]               ACCOUNT NO.:  [id number]  MEDICAL RECORD NO.:  [id number]           PATIENT TYPE:  I  LOCATION:  2035                         FACILITY:  [facility]  PHYSICIAN:  Gerrit Friends. Dietrich Pates, MD, FACCDATE OF BIRTH:  Oct 29, 1945  DATE OF ADMISSION:  07/04/2010 DATE OF DISCHARGE:  07/05/2010                              DISCHARGE SUMMARY   ADDENDUM  At the time of discharge, the patient requested continuation of Ambien 2 mg at bedtime p.r.n.  I agreed to give her a short course of this and  wrote a prescription, with no refills, and with recommendation that she subsequently follow with Dr. [name], for future management and adjustment of this medication.     Gene Serpe, PA-C   ______________________________ Gerrit Friends. Dietrich Pates, MD, [facility]    GS/MEDQ  D:  07/05/2010  T:  07/05/2010  Job:  [id number]  Electronically Signed by Rozell Searing PA-C on 07/16/2010 05:31:25 PM Electronically Signed by [place] Bing MD [facility] on 08/06/2010 06:40:51 PM

## 2010-08-06 NOTE — Discharge Summary (Signed)
  NAMECHADE, PITNER               ACCOUNT NO.:  [id number]  MEDICAL RECORD NO.:  [id number]           PATIENT TYPE:  I  LOCATION:  2035                         FACILITY:  [facility]  PHYSICIAN:  Gerrit Friends. Dietrich Pates, MD, FACCDATE OF BIRTH:  Sep 10, 1945  DATE OF ADMISSION:  07/04/2010 DATE OF DISCHARGE:  07/05/2010                              DISCHARGE SUMMARY   PRIMARY CARDIOLOGIST:  Gerrit Friends. Dietrich Pates, MD, [facility] (new)  DISCHARGE DIAGNOSIS: 1. Chest pain.     a.     Normal cardiac markers.     b.     Negative Lexiscan Myoview; ejection fraction 64%, this      admission.  SECONDARY DIAGNOSES: 1. Type 2 diabetes mellitus. 2. Hypertension. 3. Dyslipidemia.  REASON FOR ADMISSION:  [name] is a 65 year old female, with no known history of coronary artery disease, who presented with complaint of progressive exertional chest discomfort.  HOSPITAL COURSE:  Serial cardiac markers were drawn, and all within normal limits.  Given this, she was cleared to proceed with an in-house evaluation with a pharmacologic nuclear imaging study, for risk stratification.  The patient reported no chest pain during the procedure, nor developed any significant EKG changes.  Subsequent review of perfusion imaging revealed no perfusion defects; calculated ejection fraction 64%.  The patient was cleared for discharge later that same day.  Arrangements will be made for her to establish with Dr. Dietrich Pates, in our [facility].  The patient will resume all previous home medications, with the addition of p.r.n. nitroglycerin.  DISCHARGE LABORATORY DATA:  CPK 80/0.9, troponin I 0.01.  Total cholesterol 176, triglyceride 333, HDL 33, and LDL 76.  OUTSTANDING LABORATORY DATA:  Serial cardiac markers normal.  TSH 1.45. Liver function tests normal.  Potassium 3.9, BUN 17, creatinine 0.7 on admission.  CBC normal on admission.  Admission chest x-ray:  No acute disease.  DISPOSITION:   Stable.  FOLLOWUP:  Dr. Coupland Bing in 2 weeks, in our [facility]. Arrangements to be made through our office.  DISCHARGE MEDICATIONS: 1. Aspirin 81 mg daily. 2. Enalapril 20 mg daily. 3. Hydrochlorothiazide 25 mg daily. 4. Metformin 500 mg daily. 5. Xanax 0.5-1 mg t.i.d. 6. Nitroglycerin p.r.n.  DURATION OF DISCHARGE ENCOUNTER:  Greater 30 minutes, including physician time.     Gene Serpe, PA-C   ______________________________ Gerrit Friends. Dietrich Pates, MD, [facility]    GS/MEDQ  D:  07/05/2010  T:  07/06/2010  Job:  [id number]  cc:   Mila Homer. Sudie Bailey, M.D.  Electronically Signed by Rozell Searing PA-C on 07/08/2010 11:01:28 AM Electronically Signed by [place] Bing MD [facility] on 08/06/2010 06:40:47 PM

## 2010-08-06 NOTE — H&P (Signed)
[name], [name]               ACCOUNT NO.:  [id number]  MEDICAL RECORD NO.:  [id number]           PATIENT TYPE:  E  LOCATION:  [facility]                          FACILITY:  [facility]  PHYSICIAN:  Gerrit Friends. Dietrich Pates, MD, FACCDATE OF BIRTH:  1945/05/21  DATE OF ADMISSION:  07/04/2010 DATE OF DISCHARGE:  02/17/2012LH                             HISTORY & PHYSICAL   PRIMARY CARE PHYSICIAN:  Mila Homer. Sudie Bailey, MD  CHIEF COMPLAINT:  Chest discomfort.  HISTORY OF PRESENT ILLNESS:  [name] is a 65 year old Caucasian female with no known history of coronary artery disease having had a negative Myoview in 2001 and no reason for recurrent study until this presentation but with significant history for all CAD equivalent of non- insulin-dependent diabetes mellitus (well controlled with HbA1c of 6.2%), risk factors of hypertension (well controlled), hyperlipidemia, and positive family history who presents with increasing frequency of mild exertional chest discomfort in the setting of significant increased stress and a complex set of symptoms that occurred today at rest as outlined below.  Claire Weaver reports noticing some mild chest discomfort after finishing vacuuming that will resolve with rest, but often return if she continues to her most exerting activity which is cleaning the house.  She often has no awareness of her symptoms.  However, until she stopped to take a break.  She does not have significant dyspnea or any other complaints with this.  She has had some palpitations in the past that are not associated with exertion.  These episodes have not been associated with presyncope or frank syncope or any other significant symptoms are usually short duration, lasting only a couple of minutes.  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  She also had some mild chest discomfort but this  was not significant compared to her nausea.  She got up and went to the bathroom because she was concerned she might vomit.  She felt like "I might explode."  However, she did not vomit.  After a couple of minutes, she felt like it was safe to go back to her desk.  When she got back to her desk, however, she had worsening of her chest discomfort radiating down into her abdomen and then presyncope and continued mild nausea.  She put her head on the desk and explained how poorly she felt to a coworker.  Her blood pressure was then checked which she says was in the 70s systolic and her appearance was ashen at which point the physician she works for insisted she present to [facility] for further evaluation.  At [facility], she was given no medications but her symptoms resolved after about 10-15 minutes.  Vital signs with very mild hypertension, peak systolic at 143, otherwise within normal limits and stable.  Chest x-ray with no acute disease.  EKG with mild T-wave inversion in V1 only.  No other significant changes.  Labs unremarkable including two sets of point-of-care markers were negative and a D-dimer that was within normal limits.  She currently has no  symptoms and other than her concern about what happened earlier would feel fine to home.  PAST MEDICAL HISTORY: 1. NIDDM (HbA1c 6.2%). 2. Anxiety. 3. Hypertension (well controlled). 4. Hyperlipidemia.  SOCIAL HISTORY:  The patient lives in [place] with her family.  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  She has no history of tobacco abuse.  No EtOH use.  No illicit drug use.  No herbal meds.  Regular diet.  No regular exercise.  FAMILY HISTORY:  The patient has a younger brother who was diagnosed with coronary artery disease in his mid to late 54s, otherwise negative for premature diagnosis of coronary artery disease.  REVIEW OF SYSTEMS:  Please see HPI.  All other systems were reviewed and were negative  except for noted increased stress recently related to difficulties with her son.  CODE STATUS:  Full.  ALLERGIES:  NKDA.  MEDICATIONS: 1. Enteric-coated aspirin 81 mg p.o. daily. 2. Metformin. 3. Xanax.  REVIEW OF SYSTEMS:  The patient also reports increasing frequency of headaches recently, that can be moderate-to-severe lasting up to over an hour associated with left posterior neck discomfort, when her headaches resolve the left posterior neck discomfort also tends to resolve quickly.  PHYSICAL EXAMINATION:  VITAL SIGNS:  Temperature 98.2 degrees Fahrenheit, BP 123-143/66-70 with a pulse in the 70s, respirations 18- 20, and O2 saturation 98% on 2 L by nasal cannula. GENERAL:  The patient is alert and oriented x3 in no apparent stress, able to speak easily in full sentences without respiratory distress. HEENT:  Head is normocephalic and atraumatic.  Pupils equal, round, reactive to light.  Extraocular muscles are intact.  There is pain without discharge.  Dentition is fair.  Oropharynx without erythema or exudate. NECK:  Supple without lymphadenopathy.  No thyromegaly.  No bruit. HEART:  Rate is regular with audible S1 and S2.  No clicks, rubs, murmurs, or gallops.  Pulses are 2+ and equal in both upper and extremities bilaterally. LUNGS:  Clear to auscultation bilaterally. SKIN:  No rashes, lesions, or petechiae. ABDOMEN:  Soft, nontender, nondistended.  Normal abdominal bowel sounds. No rebound or guarding.  No hepatosplenomegaly.  EXTREMITIES:  Without clubbing, cyanosis, or edema. MUSCULOSKELETAL:  Without joint deformity or effusions.  No spinal or CVA tenderness. NEURO:  Cranial nerves II through XII grossly intact with strength 5/5 in all extremities and axial groups normal, sensation throughout, and normal cerebellar function.  RADIOLOGY:  Chest x-ray showed no acute cardiopulmonary disease.  EKG:  Rhythm NSR with RSR prime pattern, minimal T-wave inversion in  V1, nonspecific changes, otherwise no significant Q-waves, normal axis, no evidence of hypertrophy.  PR 152, QRS 92, and QTC of 442 except for the mild T-wave inversion in V1.  No significant changes from prior tracing completed in September 2010.  LABORATORY DATA:  WBC is 4.7, HGB 12.6, HCT 38.0, PLT count 192.  Sodium 139, potassium 3.8, chloride 104, bicarb 24, BUN 22, creatinine 0.92, glucose 188, D-dimer 0.23.  Point-of-care markers negative x2.  ASSESSMENT AND PLAN:  The patient initially seen by Lenard Galloway, PAC, and then seen and thoroughly assessed by attending cardiologist, Dr. [place] Bing.  Ms. Surratt is a 65 year old Caucasian female with no known history of coronary artery disease, negative Myoview in 2001, CAD equivalent of well controlled diabetes mellitus (not on insulin), risk factors of well- controlled hypertension, hyperlipidemia, and positive family history as well as history significant for anxiety who presents with an overall atypical symptoms for cardiac etiology with thus far  no objective evidence, but moderately high pretest probability.  Chest pain - The patient reports a 63-month history of exertional chest discomfort today with severe nausea and epigastric discomfort with some lower chest discomfort as well and what appeared to be vagal symptoms.  EKG is negative during discomfort.  Point-of-care markers negative x2.  Attending request transferred to [facility]. Plan will be to check stools, amylase, lipase, and empiric treatment with proton pump inhibitor and continued home medications, will also rule out with serial cardiac enzymes and likely stress Myoview in the morning.     Jarrett Ables, PAC   ______________________________ Gerrit Friends. Dietrich Pates, MD, [facility]    MS/MEDQ  D:  07/04/2010  T:  07/04/2010  Job:  [id number]  Electronically Signed by Jarrett Ables PAC on 07/08/2010 01:13:53 PM Electronically Signed by [place] Bing  MD [facility] on 08/06/2010 06:41:05 PM

## 2010-08-22 LAB — CARDIAC PANEL(CRET KIN+CKTOT+MB+TROPI)
CK, MB: 0.7 ng/mL (ref 0.3–4.0)
CK, MB: 0.8 ng/mL (ref 0.3–4.0)
CK, MB: 0.9 ng/mL (ref 0.3–4.0)
Relative Index: INVALID (ref 0.0–2.5)
Relative Index: INVALID (ref 0.0–2.5)
Total CK: 80 U/L (ref 7–177)
Troponin I: 0.02 ng/mL (ref 0.00–0.06)
Troponin I: 0.02 ng/mL (ref 0.00–0.06)

## 2010-08-22 LAB — URINALYSIS, ROUTINE W REFLEX MICROSCOPIC
Bilirubin Urine: NEGATIVE
Glucose, UA: NEGATIVE mg/dL
Hgb urine dipstick: NEGATIVE
Ketones, ur: NEGATIVE mg/dL
Nitrite: NEGATIVE
Protein, ur: NEGATIVE mg/dL
Specific Gravity, Urine: 1.027 (ref 1.005–1.030)
Urobilinogen, UA: 0.2 mg/dL (ref 0.0–1.0)
pH: 5.5 (ref 5.0–8.0)

## 2010-08-22 LAB — CBC
HCT: 29.6 % — ABNORMAL LOW (ref 36.0–46.0)
HCT: 31.2 % — ABNORMAL LOW (ref 36.0–46.0)
HCT: 33.2 % — ABNORMAL LOW (ref 36.0–46.0)
HCT: 36.4 % (ref 36.0–46.0)
Hemoglobin: 10.8 g/dL — ABNORMAL LOW (ref 12.0–15.0)
Hemoglobin: 10 g/dL — ABNORMAL LOW (ref 12.0–15.0)
Hemoglobin: 10.7 g/dL — ABNORMAL LOW (ref 12.0–15.0)
Hemoglobin: 12.5 g/dL (ref 12.0–15.0)
MCHC: 35.2 g/dL (ref 30.0–36.0)
MCHC: 33.7 g/dL (ref 30.0–36.0)
MCHC: 34.3 g/dL (ref 30.0–36.0)
MCHC: 34.5 g/dL (ref 30.0–36.0)
MCHC: 34.7 g/dL (ref 30.0–36.0)
MCV: 88.3 fL (ref 78.0–100.0)
MCV: 89.9 fL (ref 78.0–100.0)
MCV: 90.4 fL (ref 78.0–100.0)
MCV: 91.6 fL (ref 78.0–100.0)
Platelets: 181 10*3/uL (ref 150–400)
Platelets: 155 10*3/uL (ref 150–400)
Platelets: 169 10*3/uL (ref 150–400)
Platelets: 177 K/uL (ref 150–400)
Platelets: 178 10*3/uL (ref 150–400)
RBC: 3.46 MIL/uL — ABNORMAL LOW (ref 3.87–5.11)
RBC: 3.41 MIL/uL — ABNORMAL LOW (ref 3.87–5.11)
RBC: 3.68 MIL/uL — ABNORMAL LOW (ref 3.87–5.11)
RBC: 4.05 MIL/uL (ref 3.87–5.11)
RDW: 12.5 % (ref 11.5–15.5)
RDW: 12.8 % (ref 11.5–15.5)
RDW: 12.9 % (ref 11.5–15.5)
RDW: 12.9 % (ref 11.5–15.5)
WBC: 4.3 10*3/uL (ref 4.0–10.5)
WBC: 13.1 10*3/uL — ABNORMAL HIGH (ref 4.0–10.5)
WBC: 13.2 10*3/uL — ABNORMAL HIGH (ref 4.0–10.5)
WBC: 4.8 K/uL (ref 4.0–10.5)
WBC: 8.4 10*3/uL (ref 4.0–10.5)

## 2010-08-22 LAB — URINE CULTURE: Colony Count: 15000

## 2010-08-22 LAB — GLUCOSE, CAPILLARY
Glucose-Capillary: 126 mg/dL — ABNORMAL HIGH (ref 70–99)
Glucose-Capillary: 139 mg/dL — ABNORMAL HIGH (ref 70–99)
Glucose-Capillary: 103 mg/dL — ABNORMAL HIGH (ref 70–99)
Glucose-Capillary: 103 mg/dL — ABNORMAL HIGH (ref 70–99)
Glucose-Capillary: 110 mg/dL — ABNORMAL HIGH (ref 70–99)
Glucose-Capillary: 110 mg/dL — ABNORMAL HIGH (ref 70–99)
Glucose-Capillary: 116 mg/dL — ABNORMAL HIGH (ref 70–99)
Glucose-Capillary: 117 mg/dL — ABNORMAL HIGH (ref 70–99)
Glucose-Capillary: 118 mg/dL — ABNORMAL HIGH (ref 70–99)
Glucose-Capillary: 119 mg/dL — ABNORMAL HIGH (ref 70–99)
Glucose-Capillary: 120 mg/dL — ABNORMAL HIGH (ref 70–99)
Glucose-Capillary: 121 mg/dL — ABNORMAL HIGH (ref 70–99)
Glucose-Capillary: 122 mg/dL — ABNORMAL HIGH (ref 70–99)
Glucose-Capillary: 124 mg/dL — ABNORMAL HIGH (ref 70–99)
Glucose-Capillary: 131 mg/dL — ABNORMAL HIGH (ref 70–99)
Glucose-Capillary: 138 mg/dL — ABNORMAL HIGH (ref 70–99)
Glucose-Capillary: 147 mg/dL — ABNORMAL HIGH (ref 70–99)
Glucose-Capillary: 155 mg/dL — ABNORMAL HIGH (ref 70–99)
Glucose-Capillary: 163 mg/dL — ABNORMAL HIGH (ref 70–99)
Glucose-Capillary: 166 mg/dL — ABNORMAL HIGH (ref 70–99)
Glucose-Capillary: 180 mg/dL — ABNORMAL HIGH (ref 70–99)
Glucose-Capillary: 203 mg/dL — ABNORMAL HIGH (ref 70–99)
Glucose-Capillary: 87 mg/dL (ref 70–99)

## 2010-08-22 LAB — BASIC METABOLIC PANEL
BUN: 11 mg/dL (ref 6–23)
CO2: 27 mEq/L (ref 19–32)
Calcium: 8.7 mg/dL (ref 8.4–10.5)
Chloride: 106 mEq/L (ref 96–112)
Creatinine, Ser: 0.76 mg/dL (ref 0.4–1.2)
GFR calc Af Amer: >60 mL/min (ref >60)
GFR calc non Af Amer: >60 mL/min (ref >60)
Sodium: 139 mEq/L (ref 135–145)

## 2010-08-22 LAB — COMPREHENSIVE METABOLIC PANEL
AST: 26 U/L (ref 0–37)
Albumin: 3.7 g/dL (ref 3.5–5.2)
BUN: 13 mg/dL (ref 6–23)
CO2: 26 mEq/L (ref 19–32)
Chloride: 105 mEq/L (ref 96–112)
Creatinine, Ser: 0.79 mg/dL (ref 0.4–1.2)
GFR calc Af Amer: >60 mL/min (ref >60)
Potassium: 3.5 mEq/L (ref 3.5–5.1)
Sodium: 138 mEq/L (ref 135–145)
Total Bilirubin: 0.5 mg/dL (ref 0.3–1.2)
Total Protein: 6.8 g/dL (ref 6.0–8.3)

## 2010-08-22 LAB — BASIC METABOLIC PANEL WITH GFR
BUN: 5 mg/dL — ABNORMAL LOW (ref 6–23)
CO2: 25 meq/L (ref 19–32)
Calcium: 8.2 mg/dL — ABNORMAL LOW (ref 8.4–10.5)
Chloride: 110 meq/L (ref 96–112)
Creatinine, Ser: 0.65 mg/dL (ref 0.4–1.2)
GFR calc non Af Amer: >60 mL/min
Glucose, Bld: 117 mg/dL — ABNORMAL HIGH (ref 70–99)
Potassium: 3.5 meq/L (ref 3.5–5.1)
Sodium: 141 meq/L (ref 135–145)

## 2010-08-22 LAB — DIFFERENTIAL
Basophils Absolute: 0 10*3/uL (ref 0.0–0.1)
Lymphocytes Relative: 11 % — ABNORMAL LOW (ref 12–46)
Lymphs Abs: 1.5 10*3/uL (ref 0.7–4.0)
Monocytes Absolute: 0.6 10*3/uL (ref 0.1–1.0)
Neutro Abs: 11.1 10*3/uL — ABNORMAL HIGH (ref 1.7–7.7)
Neutrophils Relative %: 84 % — ABNORMAL HIGH (ref 43–77)

## 2010-08-22 LAB — LIPASE, BLOOD: Lipase: 13 U/L (ref 11–59)

## 2011-05-21 ENCOUNTER — Other Ambulatory Visit (HOSPITAL_COMMUNITY): Payer: Self-pay | Admitting: Family Medicine

## 2011-05-21 DIAGNOSIS — Z139 Encounter for screening, unspecified: Secondary | ICD-10-CM

## 2011-05-28 ENCOUNTER — Ambulatory Visit (HOSPITAL_COMMUNITY): Payer: Self-pay

## 2011-06-04 ENCOUNTER — Ambulatory Visit (HOSPITAL_COMMUNITY)
Admission: RE | Admit: 2011-06-04 | Discharge: 2011-06-04 | Disposition: A | Discharge status: Home / Self Care | Payer: Medicare Other | Source: Ambulatory Visit | Attending: Family Medicine | Admitting: Family Medicine

## 2011-06-04 DIAGNOSIS — Z1231 Encounter for screening mammogram for malignant neoplasm of breast: Secondary | ICD-10-CM | POA: Insufficient documentation

## 2011-06-04 DIAGNOSIS — Z139 Encounter for screening, unspecified: Secondary | ICD-10-CM

## 2012-07-20 ENCOUNTER — Other Ambulatory Visit (HOSPITAL_COMMUNITY): Payer: Self-pay | Admitting: Family Medicine

## 2012-07-20 DIAGNOSIS — Z139 Encounter for screening, unspecified: Secondary | ICD-10-CM

## 2012-07-27 ENCOUNTER — Encounter (INDEPENDENT_AMBULATORY_CARE_PROVIDER_SITE_OTHER): Payer: Self-pay | Admitting: *Deleted

## 2012-07-28 ENCOUNTER — Ambulatory Visit (HOSPITAL_COMMUNITY)
Admission: RE | Admit: 2012-07-28 | Discharge: 2012-07-28 | Disposition: A | Discharge status: Home / Self Care | Payer: Medicare Other | Source: Ambulatory Visit | Attending: Family Medicine | Admitting: Family Medicine

## 2012-07-28 ENCOUNTER — Other Ambulatory Visit (INDEPENDENT_AMBULATORY_CARE_PROVIDER_SITE_OTHER): Payer: Self-pay | Admitting: *Deleted

## 2012-07-28 ENCOUNTER — Telehealth (INDEPENDENT_AMBULATORY_CARE_PROVIDER_SITE_OTHER): Payer: Self-pay | Admitting: *Deleted

## 2012-07-28 DIAGNOSIS — Z1211 Encounter for screening for malignant neoplasm of colon: Secondary | ICD-10-CM

## 2012-07-28 DIAGNOSIS — Z1231 Encounter for screening mammogram for malignant neoplasm of breast: Secondary | ICD-10-CM | POA: Insufficient documentation

## 2012-07-28 DIAGNOSIS — Z139 Encounter for screening, unspecified: Secondary | ICD-10-CM

## 2012-07-28 MED ORDER — PEG-KCL-NACL-NASULF-NA ASC-C 100 G PO SOLR: 1.0000 | Freq: Once | ORAL | Status: DC

## 2012-07-28 NOTE — Telephone Encounter (Signed)
Patient needs movi prep 

## 2012-08-03 ENCOUNTER — Other Ambulatory Visit (HOSPITAL_COMMUNITY): Payer: Self-pay | Admitting: Family Medicine

## 2012-08-03 ENCOUNTER — Ambulatory Visit (HOSPITAL_COMMUNITY)
Admission: RE | Admit: 2012-08-03 | Discharge: 2012-08-03 | Disposition: A | Discharge status: Home / Self Care | Payer: Medicare Other | Source: Ambulatory Visit | Attending: Family Medicine | Admitting: Family Medicine

## 2012-08-03 ENCOUNTER — Ambulatory Visit (HOSPITAL_COMMUNITY)
Admission: EM | Admit: 2012-08-03 | Discharge: 2012-08-03 | Disposition: A | Discharge status: Home / Self Care | Payer: Medicare Other | Attending: Family Medicine | Admitting: Family Medicine

## 2012-08-03 DIAGNOSIS — R05 Cough: Secondary | ICD-10-CM | POA: Insufficient documentation

## 2012-08-03 DIAGNOSIS — R059 Cough, unspecified: Secondary | ICD-10-CM | POA: Insufficient documentation

## 2012-08-03 DIAGNOSIS — E119 Type 2 diabetes mellitus without complications: Secondary | ICD-10-CM | POA: Insufficient documentation

## 2012-08-03 DIAGNOSIS — I10 Essential (primary) hypertension: Secondary | ICD-10-CM | POA: Insufficient documentation

## 2012-08-04 ENCOUNTER — Telehealth (INDEPENDENT_AMBULATORY_CARE_PROVIDER_SITE_OTHER): Payer: Self-pay | Admitting: *Deleted

## 2012-08-04 NOTE — Telephone Encounter (Signed)
agree

## 2012-08-04 NOTE — Telephone Encounter (Signed)
  Procedure: tcs  Reason/Indication:  screening  Has patient had this procedure before?  Yes, 15 yrs ago  If so, when, by whom and where?    Is there a family history of colon cancer?  no  Who?  What age when diagnosed?    Is patient diabetic?   yes      Does patient have prosthetic heart valve?  no  Do you have a pacemaker?  no  Has patient ever had endocarditis? no  Has patient had joint replacement within last 12 months?  no  Is patient on Coumadin, Plavix and/or Aspirin? yes  Medications: asa 81 mg daily, glipizide 10 mg day (1/2 tab in am & 1/2 tab in pm), hctz 25 mg daily, losartan 50 mg daily, alprazolam 1 mg 1 tab qid prn, lisinopril 10 mg daily  Allergies: bactrim, shell fish  Medication Adjustment: asa 2 days, hold evening dose glipizide day before an morning of  Procedure date & time: 09/01/12 at 100

## 2012-08-19 ENCOUNTER — Encounter (HOSPITAL_COMMUNITY): Payer: Self-pay | Admitting: Pharmacy Technician

## 2012-08-30 ENCOUNTER — Telehealth (INDEPENDENT_AMBULATORY_CARE_PROVIDER_SITE_OTHER): Payer: Self-pay | Admitting: *Deleted

## 2012-08-30 NOTE — Telephone Encounter (Signed)
Roselia would like for Terri to return her call at [phone]. She is having some GI issues going on and needs to speak with her personally.

## 2012-08-30 NOTE — Telephone Encounter (Signed)
Claire Weaver, I have never seen this patient. She was triaged. Find out what is going on

## 2012-08-30 NOTE — Telephone Encounter (Signed)
Advised Claire Weaver that Claire Weaver has never seen her before, therefore Claire Weaver could not give her any advise over the phone. Told Claire Weaver to get a referral sent to [facility] from her PCP and Claire Weaver would be glad to see her.

## 2012-09-01 ENCOUNTER — Encounter (HOSPITAL_COMMUNITY): Admission: RE | Payer: Self-pay | Source: Ambulatory Visit

## 2012-09-01 ENCOUNTER — Ambulatory Visit (HOSPITAL_COMMUNITY): Admission: RE | Admit: 2012-09-01 | Payer: Medicare Other | Source: Ambulatory Visit | Admitting: Internal Medicine

## 2012-09-01 SURGERY — COLONOSCOPY
Anesthesia: Moderate Sedation

## 2012-10-05 ENCOUNTER — Other Ambulatory Visit: Payer: Self-pay | Admitting: Adult Health

## 2012-11-24 ENCOUNTER — Other Ambulatory Visit: Payer: Self-pay | Admitting: Adult Health

## 2013-07-25 ENCOUNTER — Other Ambulatory Visit: Payer: Self-pay | Admitting: Family Medicine

## 2013-07-25 ENCOUNTER — Other Ambulatory Visit (HOSPITAL_COMMUNITY)
Admission: RE | Admit: 2013-07-25 | Discharge: 2013-07-25 | Disposition: A | Discharge status: Home / Self Care | Payer: Medicare HMO | Source: Ambulatory Visit | Attending: Family Medicine | Admitting: Family Medicine

## 2013-07-25 DIAGNOSIS — Z124 Encounter for screening for malignant neoplasm of cervix: Secondary | ICD-10-CM | POA: Insufficient documentation

## 2013-07-25 DIAGNOSIS — Z1151 Encounter for screening for human papillomavirus (HPV): Secondary | ICD-10-CM | POA: Insufficient documentation

## 2013-07-27 ENCOUNTER — Other Ambulatory Visit (HOSPITAL_COMMUNITY): Payer: Self-pay | Admitting: Family Medicine

## 2013-07-27 DIAGNOSIS — Z1231 Encounter for screening mammogram for malignant neoplasm of breast: Secondary | ICD-10-CM

## 2013-08-03 ENCOUNTER — Inpatient Hospital Stay (HOSPITAL_COMMUNITY): Admission: RE | Admit: 2013-08-03 | Payer: Medicare Other | Source: Ambulatory Visit

## 2013-08-21 ENCOUNTER — Ambulatory Visit (HOSPITAL_COMMUNITY): Payer: Medicare Other

## 2013-08-24 ENCOUNTER — Ambulatory Visit (HOSPITAL_COMMUNITY)
Admission: RE | Admit: 2013-08-24 | Discharge: 2013-08-24 | Disposition: A | Discharge status: Home / Self Care | Payer: Medicare HMO | Source: Ambulatory Visit | Attending: Family Medicine | Admitting: Family Medicine

## 2013-08-24 DIAGNOSIS — Z1231 Encounter for screening mammogram for malignant neoplasm of breast: Secondary | ICD-10-CM | POA: Insufficient documentation

## 2013-08-28 ENCOUNTER — Other Ambulatory Visit: Payer: Self-pay | Admitting: Family Medicine

## 2013-08-28 DIAGNOSIS — IMO0002 Reserved for concepts with insufficient information to code with codable children: Secondary | ICD-10-CM

## 2013-08-28 DIAGNOSIS — R229 Localized swelling, mass and lump, unspecified: Secondary | ICD-10-CM

## 2013-08-28 DIAGNOSIS — R52 Pain, unspecified: Secondary | ICD-10-CM

## 2013-09-04 ENCOUNTER — Ambulatory Visit
Admission: RE | Admit: 2013-09-04 | Discharge: 2013-09-04 | Disposition: A | Discharge status: Home / Self Care | Payer: Medicare HMO | Source: Ambulatory Visit | Attending: Family Medicine | Admitting: Family Medicine

## 2013-09-04 DIAGNOSIS — R52 Pain, unspecified: Secondary | ICD-10-CM

## 2013-09-04 DIAGNOSIS — IMO0002 Reserved for concepts with insufficient information to code with codable children: Secondary | ICD-10-CM

## 2013-09-04 DIAGNOSIS — R229 Localized swelling, mass and lump, unspecified: Secondary | ICD-10-CM

## 2013-12-04 ENCOUNTER — Other Ambulatory Visit: Payer: Self-pay | Admitting: Family Medicine

## 2013-12-04 DIAGNOSIS — R42 Dizziness and giddiness: Secondary | ICD-10-CM

## 2013-12-04 DIAGNOSIS — M542 Cervicalgia: Secondary | ICD-10-CM

## 2013-12-21 ENCOUNTER — Other Ambulatory Visit: Payer: Medicare HMO

## 2014-01-23 ENCOUNTER — Other Ambulatory Visit: Payer: Self-pay | Admitting: Physician Assistant

## 2014-01-23 DIAGNOSIS — D229 Melanocytic nevi, unspecified: Secondary | ICD-10-CM

## 2014-01-23 HISTORY — DX: Melanocytic nevi, unspecified: D22.9

## 2014-02-08 ENCOUNTER — Other Ambulatory Visit: Payer: Medicare HMO

## 2014-09-25 ENCOUNTER — Other Ambulatory Visit: Payer: Self-pay

## 2014-09-25 ENCOUNTER — Encounter: Payer: Self-pay | Admitting: Medical Oncology

## 2014-09-25 ENCOUNTER — Emergency Department
Admission: EM | Admit: 2014-09-25 | Discharge: 2014-09-25 | Disposition: A | Discharge status: Home / Self Care | Payer: PPO | Attending: Emergency Medicine | Admitting: Emergency Medicine

## 2014-09-25 DIAGNOSIS — I10 Essential (primary) hypertension: Secondary | ICD-10-CM | POA: Insufficient documentation

## 2014-09-25 DIAGNOSIS — R079 Chest pain, unspecified: Secondary | ICD-10-CM

## 2014-09-25 DIAGNOSIS — E119 Type 2 diabetes mellitus without complications: Secondary | ICD-10-CM | POA: Insufficient documentation

## 2014-09-25 DIAGNOSIS — Z9189 Other specified personal risk factors, not elsewhere classified: Secondary | ICD-10-CM

## 2014-09-25 DIAGNOSIS — Z79899 Other long term (current) drug therapy: Secondary | ICD-10-CM | POA: Insufficient documentation

## 2014-09-25 LAB — BASIC METABOLIC PANEL
Anion gap: 8 (ref 5–15)
BUN: 18 mg/dL (ref 6–20)
CALCIUM: 8.8 mg/dL — AB (ref 8.9–10.3)
CO2: 26 mmol/L (ref 22–32)
Chloride: 105 mmol/L (ref 101–111)
Creatinine, Ser: 0.67 mg/dL (ref 0.44–1.00)
GFR calc Af Amer: >60 mL/min (ref >60)
GFR calc non Af Amer: >60 mL/min (ref >60)
Glucose, Bld: 111 mg/dL — ABNORMAL HIGH (ref 65–99)
Potassium: 4 mmol/L (ref 3.5–5.1)
SODIUM: 139 mmol/L (ref 135–145)

## 2014-09-25 LAB — CBC
HEMATOCRIT: 36.9 % (ref 35.0–47.0)
Hemoglobin: 12.4 g/dL (ref 12.0–16.0)
MCH: 30 pg (ref 26.0–34.0)
MCHC: 33.5 g/dL (ref 32.0–36.0)
MCV: 89.5 fL (ref 80.0–100.0)
Platelets: 183 10*3/uL (ref 150–440)
RBC: 4.13 MIL/uL (ref 3.80–5.20)
RDW: 13.1 % (ref 11.5–14.5)
WBC: 4.4 10*3/uL (ref 3.6–11.0)

## 2014-09-25 LAB — TROPONIN I
Troponin I: <0.03 ng/mL (ref <0.031)
Troponin I: <0.03 ng/mL (ref <0.031)

## 2014-09-25 MED ORDER — GI COCKTAIL ~~LOC~~
ORAL | Status: AC
  Administered 2014-09-25: 30 mL via ORAL
  Filled 2014-09-25: qty 30

## 2014-09-25 MED ORDER — FAMOTIDINE 20 MG PO TABS
40.0000 mg | ORAL_TABLET | Freq: Once | ORAL | Status: AC
  Administered 2014-09-25: 40 mg via ORAL
  Filled 2014-09-25: qty 2

## 2014-09-25 MED ORDER — GI COCKTAIL ~~LOC~~
30.0000 mL | Freq: Once | ORAL | Status: AC
  Administered 2014-09-25: 30 mL via ORAL

## 2014-09-25 MED ORDER — RANITIDINE HCL 150 MG PO CAPS: 150.0000 mg | ORAL_CAPSULE | Freq: Two times a day (BID) | ORAL | Status: DC

## 2014-09-25 NOTE — ED Notes (Signed)
Pt sent from [facility] with reports of central chest pain that woke her up around 0530 this am. Reports pain is radiating into left arm and pt also feels sob.

## 2014-09-25 NOTE — Discharge Instructions (Signed)

## 2014-09-25 NOTE — ED Provider Notes (Signed)
[facility] Emergency Department Provider Note  ____________________________________________  Time seen: 10:15 AM  I have reviewed the triage vital signs and the nursing notes.   HISTORY  Chief Complaint Chest Pain    HPI Claire Weaver is a 69 y.o. female who complains of chest pain that woke her up at about 5:30 this morning. The pain is radiating into her left arm. She is associated with mild shortness of breath. There is no vomiting or diaphoresis. She had similar pain about 4 years ago and was admitted to the hospital at [facility] where she underwent heart catheterization. I looked up the report from this and it showed no evidence of Horner artery disease at all. The catheterization was unremarkable. The patient reports that she took a Nexium and a Xanax this morning after the pain started and went back to sleep. However, it persisted throughout the morning and so she came to the ED. The pain as a tightness, radiating as above, constant, waxing and waning, moderate severity, nonexertional and not pleuritic. No other associated symptoms. It's not positional. She previously had been evaluated by [facility] cardiology     Past Medical History  Diagnosis Date  . Diabetes mellitus   . Hypertension     Patient Active Problem List   Diagnosis Date Noted  . ANEMIA, NORMOCYTIC 08/22/2009  . HEMORRHOIDS 08/22/2009  . DIVERTICULOSIS OF COLON 08/22/2009  . DIVERTICULITIS, COLON 08/22/2009    Past Surgical History  Procedure Laterality Date  . Abdominal hysterectomy    . Cholecystectomy      Current Outpatient Rx  Name  Route  Sig  Dispense  Refill  . ALPRAZolam (XANAX) 1 MG tablet   Oral   Take 1 mg by mouth daily as needed for anxiety.         Marland Kitchen aspirin EC 81 MG tablet   Oral   Take 81 mg by mouth every other day. Took one this morning due chest pain         . diphenhydrAMINE (SOMINEX) 25 MG tablet   Oral   Take 25 mg by mouth at bedtime as  needed for sleep (allergies).         Marland Kitchen glipiZIDE (GLUCOTROL XL) 5 MG 24 hr tablet   Oral   Take 5 mg by mouth daily with breakfast.         . hydrochlorothiazide (HYDRODIURIL) 25 MG tablet   Oral   Take 25 mg by mouth daily.         Marland Kitchen ibuprofen (ADVIL,MOTRIN) 200 MG tablet   Oral   Take 400 mg by mouth every 6 (six) hours as needed for pain.         Marland Kitchen losartan (COZAAR) 100 MG tablet   Oral   Take 100 mg by mouth daily.         . pravastatin (PRAVACHOL) 10 MG tablet   Oral   Take 10 mg by mouth daily.         . Vitamin D, Ergocalciferol, (DRISDOL) 50000 UNITS CAPS capsule   Oral   Take 50,000 Units by mouth every 7 (seven) days. Takes on Wednesdays         . lisinopril (PRINIVIL,ZESTRIL) 10 MG tablet   Oral   Take 10 mg by mouth daily.         . peg 3350 powder (MOVIPREP) 100 G SOLR   Oral   Take 1 kit (100 g total) by mouth once. Patient not taking:  Reported on 09/25/2014   1 kit   0   . ranitidine (ZANTAC) 150 MG capsule   Oral   Take 1 capsule (150 mg total) by mouth 2 (two) times daily.   28 capsule   0     Allergies Amoxapine and related; Bactrim; Iodinated diagnostic agents; and Sulfamethoxazole-trimethoprim  No family history on file.  Social History History  Substance Use Topics  . Smoking status: Never Smoker   . Smokeless tobacco: Not on file  . Alcohol Use: No    Review of Systems  Constitutional: No fever or chills. No weight changes Eyes:No blurry vision or double vision.  ENT: No sore throat. Cardiovascular: As per history of present illness Respiratory: No dyspnea or cough. Gastrointestinal: Negative for abdominal pain, vomiting and diarrhea.  No BRBPR or melena. Genitourinary: Negative for dysuria, urinary retention, bloody urine, or difficulty urinating. Musculoskeletal: Negative for back pain. No joint swelling or pain. Skin: Negative for rash. Neurological: Negative for headaches, focal weakness or  numbness. Psychiatric:No anxiety or depression.   Endocrine:No hot/cold intolerance, changes in energy, or sleep difficulty.  10-point ROS otherwise negative.  ____________________________________________   PHYSICAL EXAM:  VITAL SIGNS: ED Triage Vitals  Enc Vitals Group     BP 09/25/14 0907 135/72 mmHg     Pulse Rate 09/25/14 0907 63     Resp 09/25/14 0907 18     Temp 09/25/14 0907 98 F (36.7 C)     Temp Source 09/25/14 0907 Oral     SpO2 09/25/14 0907 98 %     Weight 09/25/14 0907 181 lb (82.101 kg)     Height 09/25/14 0907 '5\' 5"'  (1.651 m)     Head Cir --      Peak Flow --      Pain Score 09/25/14 0905 3     Pain Loc --      Pain Edu? --      Excl. in [place]? --      Constitutional: Alert and oriented. Well appearing and in no distress. Eyes: No scleral icterus. No conjunctival pallor. PERRL. EOMI ENT   Head: Normocephalic and atraumatic.   Nose: No congestion/rhinnorhea. No septal hematoma   Mouth/Throat: MMM, no pharyngeal erythema   Neck: No stridor. No SubQ emphysema.  Hematological/Lymphatic/Immunilogical: No cervical lymphadenopathy. Cardiovascular: RRR. Normal and symmetric distal pulses are present in all extremities. No murmurs, rubs, or gallops. Respiratory: Normal respiratory effort without tachypnea nor retractions. Breath sounds are clear and equal bilaterally. No wheezes/rales/rhonchi. Gastrointestinal: Soft and nontender. No distention. There is no CVA tenderness.  No rebound, rigidity, or guarding. Genitourinary: deferred Musculoskeletal: Nontender with normal range of motion in all extremities. No joint effusions.  No lower extremity tenderness.  No edema. Neurologic:   Normal speech and language.  CN 2-10 normal. Motor grossly intact. No pronator drift.  Normal gait. No gross focal neurologic deficits are appreciated.  Skin:  Skin is warm, dry and intact. No rash noted.  No petechiae, purpura, or bullae. Psychiatric: Mood and affect  are normal. Speech and behavior are normal. Patient exhibits appropriate insight and judgment.  ____________________________________________    LABS (pertinent positives/negatives) (all labs ordered are listed, but only abnormal results are displayed) Labs Reviewed  BASIC METABOLIC PANEL - Abnormal; Notable for the following:    Glucose, Bld 111 (*)    Calcium 8.8 (*)    All other components within normal limits  CBC  TROPONIN I  TROPONIN I   ____________________________________________   EKG  Date: 09/25/2014  Rate: 62  Rhythm: normal sinus rhythm  QRS Axis: normal  Intervals: normal  ST/T Wave abnormalities: normal  Conduction Disutrbances: none  Narrative Interpretation: unremarkable      ____________________________________________    RADIOLOGY    ____________________________________________   PROCEDURES  ____________________________________________   INITIAL IMPRESSION / ASSESSMENT AND PLAN / ED COURSE  Pertinent labs & imaging results that were available during my care of the patient were reviewed by me and considered in my medical decision making (see chart for details).  The patient presents with chest pain, which is unlikely to be related to CAD or ACS given her fairly recent negative heart catheterization. I have low suspicion of PE, pneumothorax, carditis, pneumonia, sepsis, or aortic dissection. Given her risk factors of hypertension and diabetes, I'll check a troponin 2, and give her antacids for symptom relief. We'll reassess after the second troponin.  ----------------------------------------- 2:57 PM on 09/25/2014 -----------------------------------------  The patient's pain resolved shortly after receiving GI cocktail medications here in the ED. Her workup is negative and troponins are negative 2. At this point, her evaluation is most consistent with GERD, and I have low suspicion for ACS or any other emergency condition. I doubt AAA,  cholecystitis or cholangitis. The patient will follow-up with our cardiology and her primary care doctor for continued monitoring of her symptoms. She is medically stable and good condition at this time  ____________________________________________   FINAL CLINICAL IMPRESSION(S) / ED DIAGNOSES  Final diagnoses:  Chest pain with minimal risk of acute coronary syndrome      Carrie Mew, MD 09/25/14 1459

## 2014-09-25 NOTE — ED Notes (Signed)
Pt discharged home after verbalizing understanding of discharge instructions; nad noted. 

## 2014-10-09 ENCOUNTER — Other Ambulatory Visit: Payer: Self-pay | Admitting: Family Medicine

## 2014-10-09 DIAGNOSIS — Z1231 Encounter for screening mammogram for malignant neoplasm of breast: Secondary | ICD-10-CM

## 2014-11-07 ENCOUNTER — Ambulatory Visit: Payer: Medicare HMO

## 2014-11-22 ENCOUNTER — Other Ambulatory Visit: Payer: Self-pay | Admitting: Family Medicine

## 2014-11-22 DIAGNOSIS — M5489 Other dorsalgia: Secondary | ICD-10-CM

## 2014-11-26 ENCOUNTER — Ambulatory Visit
Admission: RE | Admit: 2014-11-26 | Discharge: 2014-11-26 | Disposition: A | Discharge status: Home / Self Care | Payer: PPO | Source: Ambulatory Visit | Attending: Family Medicine | Admitting: Family Medicine

## 2014-11-26 DIAGNOSIS — M5489 Other dorsalgia: Secondary | ICD-10-CM

## 2014-11-27 ENCOUNTER — Ambulatory Visit: Payer: Medicare HMO | Attending: Family Medicine

## 2015-01-29 ENCOUNTER — Other Ambulatory Visit (HOSPITAL_COMMUNITY): Payer: Self-pay | Admitting: Family Medicine

## 2015-01-29 DIAGNOSIS — Z1231 Encounter for screening mammogram for malignant neoplasm of breast: Secondary | ICD-10-CM

## 2015-02-07 ENCOUNTER — Ambulatory Visit (HOSPITAL_COMMUNITY): Payer: Medicare Other

## 2015-02-11 ENCOUNTER — Ambulatory Visit (HOSPITAL_COMMUNITY)
Admission: RE | Admit: 2015-02-11 | Discharge: 2015-02-11 | Disposition: A | Discharge status: Home / Self Care | Payer: PPO | Source: Ambulatory Visit | Attending: Family Medicine | Admitting: Family Medicine

## 2015-02-11 DIAGNOSIS — Z1231 Encounter for screening mammogram for malignant neoplasm of breast: Secondary | ICD-10-CM | POA: Diagnosis not present

## 2015-06-17 DIAGNOSIS — R251 Tremor, unspecified: Secondary | ICD-10-CM | POA: Diagnosis not present

## 2015-06-17 DIAGNOSIS — R220 Localized swelling, mass and lump, head: Secondary | ICD-10-CM | POA: Diagnosis not present

## 2015-06-17 DIAGNOSIS — R51 Headache: Secondary | ICD-10-CM | POA: Diagnosis not present

## 2015-06-17 DIAGNOSIS — Z1159 Encounter for screening for other viral diseases: Secondary | ICD-10-CM | POA: Diagnosis not present

## 2015-06-18 ENCOUNTER — Other Ambulatory Visit: Payer: Self-pay | Admitting: Family Medicine

## 2015-06-18 DIAGNOSIS — R251 Tremor, unspecified: Secondary | ICD-10-CM

## 2015-06-20 ENCOUNTER — Ambulatory Visit
Admission: RE | Admit: 2015-06-20 | Discharge: 2015-06-20 | Disposition: A | Discharge status: Home / Self Care | Payer: PPO | Source: Ambulatory Visit | Attending: Family Medicine | Admitting: Family Medicine

## 2015-06-20 DIAGNOSIS — R51 Headache: Secondary | ICD-10-CM | POA: Diagnosis not present

## 2015-06-20 DIAGNOSIS — R251 Tremor, unspecified: Secondary | ICD-10-CM

## 2015-07-29 DIAGNOSIS — R05 Cough: Secondary | ICD-10-CM | POA: Diagnosis not present

## 2015-07-29 DIAGNOSIS — R6889 Other general symptoms and signs: Secondary | ICD-10-CM | POA: Diagnosis not present

## 2015-08-12 DIAGNOSIS — E119 Type 2 diabetes mellitus without complications: Secondary | ICD-10-CM | POA: Diagnosis not present

## 2015-08-12 DIAGNOSIS — I10 Essential (primary) hypertension: Secondary | ICD-10-CM | POA: Diagnosis not present

## 2015-08-12 DIAGNOSIS — F411 Generalized anxiety disorder: Secondary | ICD-10-CM | POA: Diagnosis not present

## 2015-08-12 DIAGNOSIS — Z0000 Encounter for general adult medical examination without abnormal findings: Secondary | ICD-10-CM | POA: Diagnosis not present

## 2015-08-12 DIAGNOSIS — Z7984 Long term (current) use of oral hypoglycemic drugs: Secondary | ICD-10-CM | POA: Diagnosis not present

## 2015-08-12 DIAGNOSIS — E785 Hyperlipidemia, unspecified: Secondary | ICD-10-CM | POA: Diagnosis not present

## 2015-08-12 DIAGNOSIS — Z1211 Encounter for screening for malignant neoplasm of colon: Secondary | ICD-10-CM | POA: Diagnosis not present

## 2015-08-29 DIAGNOSIS — Z1211 Encounter for screening for malignant neoplasm of colon: Secondary | ICD-10-CM | POA: Diagnosis not present

## 2015-09-13 DIAGNOSIS — F419 Anxiety disorder, unspecified: Secondary | ICD-10-CM | POA: Diagnosis not present

## 2015-09-13 DIAGNOSIS — E119 Type 2 diabetes mellitus without complications: Secondary | ICD-10-CM | POA: Diagnosis not present

## 2015-09-13 DIAGNOSIS — R51 Headache: Secondary | ICD-10-CM | POA: Diagnosis not present

## 2015-09-13 DIAGNOSIS — Z7984 Long term (current) use of oral hypoglycemic drugs: Secondary | ICD-10-CM | POA: Diagnosis not present

## 2015-09-13 DIAGNOSIS — I10 Essential (primary) hypertension: Secondary | ICD-10-CM | POA: Diagnosis not present

## 2015-10-11 ENCOUNTER — Other Ambulatory Visit: Payer: Self-pay | Admitting: Physician Assistant

## 2015-10-11 DIAGNOSIS — D239 Other benign neoplasm of skin, unspecified: Secondary | ICD-10-CM | POA: Diagnosis not present

## 2015-10-11 DIAGNOSIS — D485 Neoplasm of uncertain behavior of skin: Secondary | ICD-10-CM | POA: Diagnosis not present

## 2015-10-11 DIAGNOSIS — L570 Actinic keratosis: Secondary | ICD-10-CM | POA: Diagnosis not present

## 2015-10-11 DIAGNOSIS — L821 Other seborrheic keratosis: Secondary | ICD-10-CM | POA: Diagnosis not present

## 2015-10-11 DIAGNOSIS — D1801 Hemangioma of skin and subcutaneous tissue: Secondary | ICD-10-CM | POA: Diagnosis not present

## 2016-02-04 ENCOUNTER — Other Ambulatory Visit (HOSPITAL_COMMUNITY): Payer: Self-pay | Admitting: Family Medicine

## 2016-02-04 DIAGNOSIS — Z1231 Encounter for screening mammogram for malignant neoplasm of breast: Secondary | ICD-10-CM

## 2016-02-24 ENCOUNTER — Ambulatory Visit (HOSPITAL_COMMUNITY): Payer: PPO

## 2016-02-24 DIAGNOSIS — Z23 Encounter for immunization: Secondary | ICD-10-CM | POA: Diagnosis not present

## 2016-02-24 DIAGNOSIS — E785 Hyperlipidemia, unspecified: Secondary | ICD-10-CM | POA: Diagnosis not present

## 2016-02-24 DIAGNOSIS — E559 Vitamin D deficiency, unspecified: Secondary | ICD-10-CM | POA: Diagnosis not present

## 2016-02-24 DIAGNOSIS — E119 Type 2 diabetes mellitus without complications: Secondary | ICD-10-CM | POA: Diagnosis not present

## 2016-02-24 DIAGNOSIS — G4700 Insomnia, unspecified: Secondary | ICD-10-CM | POA: Diagnosis not present

## 2016-02-24 DIAGNOSIS — Z7984 Long term (current) use of oral hypoglycemic drugs: Secondary | ICD-10-CM | POA: Diagnosis not present

## 2016-02-24 DIAGNOSIS — I10 Essential (primary) hypertension: Secondary | ICD-10-CM | POA: Diagnosis not present

## 2016-03-02 DIAGNOSIS — E119 Type 2 diabetes mellitus without complications: Secondary | ICD-10-CM | POA: Diagnosis not present

## 2016-03-13 ENCOUNTER — Ambulatory Visit (HOSPITAL_COMMUNITY): Payer: PPO

## 2016-03-20 ENCOUNTER — Ambulatory Visit (HOSPITAL_COMMUNITY): Payer: PPO

## 2016-03-26 DIAGNOSIS — Z1231 Encounter for screening mammogram for malignant neoplasm of breast: Secondary | ICD-10-CM | POA: Diagnosis not present

## 2016-07-10 DIAGNOSIS — L739 Follicular disorder, unspecified: Secondary | ICD-10-CM | POA: Diagnosis not present

## 2016-07-10 DIAGNOSIS — D229 Melanocytic nevi, unspecified: Secondary | ICD-10-CM | POA: Diagnosis not present

## 2016-07-10 DIAGNOSIS — L304 Erythema intertrigo: Secondary | ICD-10-CM | POA: Diagnosis not present

## 2016-09-04 DIAGNOSIS — E119 Type 2 diabetes mellitus without complications: Secondary | ICD-10-CM | POA: Diagnosis not present

## 2016-09-04 DIAGNOSIS — E785 Hyperlipidemia, unspecified: Secondary | ICD-10-CM | POA: Diagnosis not present

## 2016-09-04 DIAGNOSIS — Z0000 Encounter for general adult medical examination without abnormal findings: Secondary | ICD-10-CM | POA: Diagnosis not present

## 2016-09-04 DIAGNOSIS — M8580 Other specified disorders of bone density and structure, unspecified site: Secondary | ICD-10-CM | POA: Diagnosis not present

## 2016-09-04 DIAGNOSIS — I10 Essential (primary) hypertension: Secondary | ICD-10-CM | POA: Diagnosis not present

## 2016-09-04 DIAGNOSIS — E559 Vitamin D deficiency, unspecified: Secondary | ICD-10-CM | POA: Diagnosis not present

## 2016-09-04 DIAGNOSIS — Z1211 Encounter for screening for malignant neoplasm of colon: Secondary | ICD-10-CM | POA: Diagnosis not present

## 2016-09-04 DIAGNOSIS — Z6831 Body mass index (BMI) 31.0-31.9, adult: Secondary | ICD-10-CM | POA: Diagnosis not present

## 2016-09-04 DIAGNOSIS — Z7984 Long term (current) use of oral hypoglycemic drugs: Secondary | ICD-10-CM | POA: Diagnosis not present

## 2016-10-05 DIAGNOSIS — Z1211 Encounter for screening for malignant neoplasm of colon: Secondary | ICD-10-CM | POA: Diagnosis not present

## 2016-10-21 DIAGNOSIS — M5430 Sciatica, unspecified side: Secondary | ICD-10-CM | POA: Diagnosis not present

## 2016-12-03 DIAGNOSIS — M5136 Other intervertebral disc degeneration, lumbar region: Secondary | ICD-10-CM | POA: Diagnosis not present

## 2016-12-03 DIAGNOSIS — M48062 Spinal stenosis, lumbar region with neurogenic claudication: Secondary | ICD-10-CM | POA: Diagnosis not present

## 2016-12-11 DIAGNOSIS — M5416 Radiculopathy, lumbar region: Secondary | ICD-10-CM | POA: Diagnosis not present

## 2016-12-14 DIAGNOSIS — R0602 Shortness of breath: Secondary | ICD-10-CM | POA: Diagnosis not present

## 2016-12-14 DIAGNOSIS — E559 Vitamin D deficiency, unspecified: Secondary | ICD-10-CM | POA: Diagnosis not present

## 2016-12-14 DIAGNOSIS — E119 Type 2 diabetes mellitus without complications: Secondary | ICD-10-CM | POA: Diagnosis not present

## 2016-12-14 DIAGNOSIS — Z6831 Body mass index (BMI) 31.0-31.9, adult: Secondary | ICD-10-CM | POA: Diagnosis not present

## 2016-12-14 DIAGNOSIS — I10 Essential (primary) hypertension: Secondary | ICD-10-CM | POA: Diagnosis not present

## 2016-12-17 DIAGNOSIS — M5416 Radiculopathy, lumbar region: Secondary | ICD-10-CM | POA: Diagnosis not present

## 2016-12-17 DIAGNOSIS — M5126 Other intervertebral disc displacement, lumbar region: Secondary | ICD-10-CM | POA: Diagnosis not present

## 2016-12-17 DIAGNOSIS — M48062 Spinal stenosis, lumbar region with neurogenic claudication: Secondary | ICD-10-CM | POA: Diagnosis not present

## 2016-12-17 DIAGNOSIS — M5136 Other intervertebral disc degeneration, lumbar region: Secondary | ICD-10-CM | POA: Diagnosis not present

## 2017-01-01 DIAGNOSIS — M542 Cervicalgia: Secondary | ICD-10-CM | POA: Diagnosis not present

## 2017-01-01 DIAGNOSIS — M5136 Other intervertebral disc degeneration, lumbar region: Secondary | ICD-10-CM | POA: Diagnosis not present

## 2017-01-01 DIAGNOSIS — M5126 Other intervertebral disc displacement, lumbar region: Secondary | ICD-10-CM | POA: Diagnosis not present

## 2017-01-01 DIAGNOSIS — M48062 Spinal stenosis, lumbar region with neurogenic claudication: Secondary | ICD-10-CM | POA: Diagnosis not present

## 2017-02-05 DIAGNOSIS — I10 Essential (primary) hypertension: Secondary | ICD-10-CM | POA: Diagnosis not present

## 2017-02-05 DIAGNOSIS — Z23 Encounter for immunization: Secondary | ICD-10-CM | POA: Diagnosis not present

## 2017-02-05 DIAGNOSIS — E119 Type 2 diabetes mellitus without complications: Secondary | ICD-10-CM | POA: Diagnosis not present

## 2017-02-25 DIAGNOSIS — M48062 Spinal stenosis, lumbar region with neurogenic claudication: Secondary | ICD-10-CM | POA: Diagnosis not present

## 2017-03-08 DIAGNOSIS — E785 Hyperlipidemia, unspecified: Secondary | ICD-10-CM | POA: Diagnosis not present

## 2017-03-08 DIAGNOSIS — I10 Essential (primary) hypertension: Secondary | ICD-10-CM | POA: Diagnosis not present

## 2017-03-08 DIAGNOSIS — E119 Type 2 diabetes mellitus without complications: Secondary | ICD-10-CM | POA: Diagnosis not present

## 2017-03-08 DIAGNOSIS — M5126 Other intervertebral disc displacement, lumbar region: Secondary | ICD-10-CM | POA: Diagnosis not present

## 2017-03-08 DIAGNOSIS — E559 Vitamin D deficiency, unspecified: Secondary | ICD-10-CM | POA: Diagnosis not present

## 2017-03-08 DIAGNOSIS — M5136 Other intervertebral disc degeneration, lumbar region: Secondary | ICD-10-CM | POA: Diagnosis not present

## 2017-03-08 DIAGNOSIS — Z6831 Body mass index (BMI) 31.0-31.9, adult: Secondary | ICD-10-CM | POA: Diagnosis not present

## 2017-03-08 DIAGNOSIS — M48062 Spinal stenosis, lumbar region with neurogenic claudication: Secondary | ICD-10-CM | POA: Diagnosis not present

## 2017-03-08 DIAGNOSIS — Z7984 Long term (current) use of oral hypoglycemic drugs: Secondary | ICD-10-CM | POA: Diagnosis not present

## 2017-03-26 DIAGNOSIS — H2513 Age-related nuclear cataract, bilateral: Secondary | ICD-10-CM | POA: Diagnosis not present

## 2017-03-26 DIAGNOSIS — E119 Type 2 diabetes mellitus without complications: Secondary | ICD-10-CM | POA: Diagnosis not present

## 2017-03-26 DIAGNOSIS — H25013 Cortical age-related cataract, bilateral: Secondary | ICD-10-CM | POA: Diagnosis not present

## 2017-04-16 DIAGNOSIS — Z1231 Encounter for screening mammogram for malignant neoplasm of breast: Secondary | ICD-10-CM | POA: Diagnosis not present

## 2017-06-04 DIAGNOSIS — E119 Type 2 diabetes mellitus without complications: Secondary | ICD-10-CM | POA: Diagnosis not present

## 2017-06-04 DIAGNOSIS — I10 Essential (primary) hypertension: Secondary | ICD-10-CM | POA: Diagnosis not present

## 2017-06-04 DIAGNOSIS — E785 Hyperlipidemia, unspecified: Secondary | ICD-10-CM | POA: Diagnosis not present

## 2017-06-04 DIAGNOSIS — E559 Vitamin D deficiency, unspecified: Secondary | ICD-10-CM | POA: Diagnosis not present

## 2017-06-11 DIAGNOSIS — E875 Hyperkalemia: Secondary | ICD-10-CM | POA: Diagnosis not present

## 2017-06-14 ENCOUNTER — Other Ambulatory Visit: Payer: Self-pay | Admitting: Family Medicine

## 2017-06-14 DIAGNOSIS — K760 Fatty (change of) liver, not elsewhere classified: Secondary | ICD-10-CM

## 2017-06-14 DIAGNOSIS — R10811 Right upper quadrant abdominal tenderness: Secondary | ICD-10-CM

## 2017-06-24 ENCOUNTER — Ambulatory Visit
Admission: RE | Admit: 2017-06-24 | Discharge: 2017-06-24 | Disposition: A | Discharge status: Home / Self Care | Payer: PPO | Source: Ambulatory Visit | Attending: Family Medicine | Admitting: Family Medicine

## 2017-06-24 DIAGNOSIS — R10811 Right upper quadrant abdominal tenderness: Secondary | ICD-10-CM

## 2017-06-24 DIAGNOSIS — K760 Fatty (change of) liver, not elsewhere classified: Secondary | ICD-10-CM

## 2017-06-24 DIAGNOSIS — K7689 Other specified diseases of liver: Secondary | ICD-10-CM | POA: Diagnosis not present

## 2017-07-03 DIAGNOSIS — J101 Influenza due to other identified influenza virus with other respiratory manifestations: Secondary | ICD-10-CM | POA: Diagnosis not present

## 2017-07-19 DIAGNOSIS — J019 Acute sinusitis, unspecified: Secondary | ICD-10-CM | POA: Diagnosis not present

## 2017-08-16 DIAGNOSIS — I10 Essential (primary) hypertension: Secondary | ICD-10-CM | POA: Diagnosis not present

## 2017-08-16 DIAGNOSIS — K219 Gastro-esophageal reflux disease without esophagitis: Secondary | ICD-10-CM | POA: Diagnosis not present

## 2017-08-16 DIAGNOSIS — E7800 Pure hypercholesterolemia, unspecified: Secondary | ICD-10-CM | POA: Diagnosis not present

## 2017-08-16 DIAGNOSIS — E119 Type 2 diabetes mellitus without complications: Secondary | ICD-10-CM | POA: Diagnosis not present

## 2017-08-16 DIAGNOSIS — Z79899 Other long term (current) drug therapy: Secondary | ICD-10-CM | POA: Diagnosis not present

## 2017-09-28 DIAGNOSIS — M5416 Radiculopathy, lumbar region: Secondary | ICD-10-CM | POA: Diagnosis not present

## 2017-10-19 DIAGNOSIS — M5136 Other intervertebral disc degeneration, lumbar region: Secondary | ICD-10-CM | POA: Diagnosis not present

## 2017-11-08 DIAGNOSIS — E119 Type 2 diabetes mellitus without complications: Secondary | ICD-10-CM | POA: Diagnosis not present

## 2017-11-08 DIAGNOSIS — Z79899 Other long term (current) drug therapy: Secondary | ICD-10-CM | POA: Diagnosis not present

## 2017-11-08 DIAGNOSIS — E7800 Pure hypercholesterolemia, unspecified: Secondary | ICD-10-CM | POA: Diagnosis not present

## 2017-11-08 DIAGNOSIS — R1011 Right upper quadrant pain: Secondary | ICD-10-CM | POA: Diagnosis not present

## 2017-11-08 DIAGNOSIS — K219 Gastro-esophageal reflux disease without esophagitis: Secondary | ICD-10-CM | POA: Diagnosis not present

## 2017-11-22 DIAGNOSIS — R1032 Left lower quadrant pain: Secondary | ICD-10-CM | POA: Diagnosis not present

## 2017-12-07 DIAGNOSIS — D229 Melanocytic nevi, unspecified: Secondary | ICD-10-CM | POA: Diagnosis not present

## 2017-12-07 DIAGNOSIS — L309 Dermatitis, unspecified: Secondary | ICD-10-CM | POA: Diagnosis not present

## 2017-12-07 DIAGNOSIS — L820 Inflamed seborrheic keratosis: Secondary | ICD-10-CM | POA: Diagnosis not present

## 2017-12-24 DIAGNOSIS — Z79899 Other long term (current) drug therapy: Secondary | ICD-10-CM | POA: Diagnosis not present

## 2017-12-24 DIAGNOSIS — F330 Major depressive disorder, recurrent, mild: Secondary | ICD-10-CM | POA: Diagnosis not present

## 2017-12-24 DIAGNOSIS — Z0000 Encounter for general adult medical examination without abnormal findings: Secondary | ICD-10-CM | POA: Diagnosis not present

## 2018-01-10 DIAGNOSIS — E785 Hyperlipidemia, unspecified: Secondary | ICD-10-CM | POA: Diagnosis not present

## 2018-01-10 DIAGNOSIS — I10 Essential (primary) hypertension: Secondary | ICD-10-CM | POA: Diagnosis not present

## 2018-01-10 DIAGNOSIS — E119 Type 2 diabetes mellitus without complications: Secondary | ICD-10-CM | POA: Diagnosis not present

## 2018-01-10 DIAGNOSIS — Z7984 Long term (current) use of oral hypoglycemic drugs: Secondary | ICD-10-CM | POA: Diagnosis not present

## 2018-01-19 DIAGNOSIS — M47817 Spondylosis without myelopathy or radiculopathy, lumbosacral region: Secondary | ICD-10-CM | POA: Diagnosis not present

## 2018-02-07 DIAGNOSIS — M545 Low back pain: Secondary | ICD-10-CM | POA: Diagnosis not present

## 2018-02-07 DIAGNOSIS — M5136 Other intervertebral disc degeneration, lumbar region: Secondary | ICD-10-CM | POA: Diagnosis not present

## 2018-02-17 DIAGNOSIS — M47816 Spondylosis without myelopathy or radiculopathy, lumbar region: Secondary | ICD-10-CM | POA: Diagnosis not present

## 2018-03-16 ENCOUNTER — Other Ambulatory Visit: Payer: Self-pay | Admitting: Physician Assistant

## 2018-03-16 DIAGNOSIS — D099 Carcinoma in situ, unspecified: Secondary | ICD-10-CM

## 2018-03-16 DIAGNOSIS — D0471 Carcinoma in situ of skin of right lower limb, including hip: Secondary | ICD-10-CM | POA: Diagnosis not present

## 2018-03-16 DIAGNOSIS — L918 Other hypertrophic disorders of the skin: Secondary | ICD-10-CM | POA: Diagnosis not present

## 2018-03-16 DIAGNOSIS — L309 Dermatitis, unspecified: Secondary | ICD-10-CM | POA: Diagnosis not present

## 2018-03-16 HISTORY — DX: Carcinoma in situ, unspecified: D09.9

## 2018-03-30 DIAGNOSIS — L570 Actinic keratosis: Secondary | ICD-10-CM | POA: Diagnosis not present

## 2018-04-21 ENCOUNTER — Other Ambulatory Visit: Payer: Self-pay | Admitting: Dermatology

## 2018-04-21 DIAGNOSIS — L820 Inflamed seborrheic keratosis: Secondary | ICD-10-CM | POA: Diagnosis not present

## 2018-04-21 DIAGNOSIS — L859 Epidermal thickening, unspecified: Secondary | ICD-10-CM | POA: Diagnosis not present

## 2018-04-21 DIAGNOSIS — L570 Actinic keratosis: Secondary | ICD-10-CM | POA: Diagnosis not present

## 2018-04-21 DIAGNOSIS — D485 Neoplasm of uncertain behavior of skin: Secondary | ICD-10-CM | POA: Diagnosis not present

## 2018-04-22 DIAGNOSIS — Z7984 Long term (current) use of oral hypoglycemic drugs: Secondary | ICD-10-CM | POA: Diagnosis not present

## 2018-04-22 DIAGNOSIS — E119 Type 2 diabetes mellitus without complications: Secondary | ICD-10-CM | POA: Diagnosis not present

## 2018-04-22 DIAGNOSIS — E781 Pure hyperglyceridemia: Secondary | ICD-10-CM | POA: Diagnosis not present

## 2018-04-22 DIAGNOSIS — Z1231 Encounter for screening mammogram for malignant neoplasm of breast: Secondary | ICD-10-CM | POA: Diagnosis not present

## 2018-04-22 DIAGNOSIS — Z1211 Encounter for screening for malignant neoplasm of colon: Secondary | ICD-10-CM | POA: Diagnosis not present

## 2018-04-22 DIAGNOSIS — I10 Essential (primary) hypertension: Secondary | ICD-10-CM | POA: Diagnosis not present

## 2018-04-22 DIAGNOSIS — E785 Hyperlipidemia, unspecified: Secondary | ICD-10-CM | POA: Diagnosis not present

## 2018-05-17 DIAGNOSIS — L089 Local infection of the skin and subcutaneous tissue, unspecified: Secondary | ICD-10-CM | POA: Diagnosis not present

## 2018-06-22 ENCOUNTER — Other Ambulatory Visit: Payer: Self-pay | Admitting: Physician Assistant

## 2018-06-22 DIAGNOSIS — D0471 Carcinoma in situ of skin of right lower limb, including hip: Secondary | ICD-10-CM | POA: Diagnosis not present

## 2018-06-22 DIAGNOSIS — L570 Actinic keratosis: Secondary | ICD-10-CM | POA: Diagnosis not present

## 2018-06-22 DIAGNOSIS — D485 Neoplasm of uncertain behavior of skin: Secondary | ICD-10-CM | POA: Diagnosis not present

## 2018-06-22 DIAGNOSIS — L430 Hypertrophic lichen planus: Secondary | ICD-10-CM | POA: Diagnosis not present

## 2018-07-14 DIAGNOSIS — L570 Actinic keratosis: Secondary | ICD-10-CM | POA: Diagnosis not present

## 2018-07-14 DIAGNOSIS — K130 Diseases of lips: Secondary | ICD-10-CM | POA: Diagnosis not present

## 2018-07-14 DIAGNOSIS — L810 Postinflammatory hyperpigmentation: Secondary | ICD-10-CM | POA: Diagnosis not present

## 2018-07-15 DIAGNOSIS — E559 Vitamin D deficiency, unspecified: Secondary | ICD-10-CM | POA: Diagnosis not present

## 2018-07-15 DIAGNOSIS — Z1211 Encounter for screening for malignant neoplasm of colon: Secondary | ICD-10-CM | POA: Diagnosis not present

## 2018-07-15 DIAGNOSIS — E785 Hyperlipidemia, unspecified: Secondary | ICD-10-CM | POA: Diagnosis not present

## 2018-07-15 DIAGNOSIS — I10 Essential (primary) hypertension: Secondary | ICD-10-CM | POA: Diagnosis not present

## 2018-07-15 DIAGNOSIS — Z7984 Long term (current) use of oral hypoglycemic drugs: Secondary | ICD-10-CM | POA: Diagnosis not present

## 2018-07-15 DIAGNOSIS — E119 Type 2 diabetes mellitus without complications: Secondary | ICD-10-CM | POA: Diagnosis not present

## 2018-07-15 DIAGNOSIS — Z0000 Encounter for general adult medical examination without abnormal findings: Secondary | ICD-10-CM | POA: Diagnosis not present

## 2018-07-25 DIAGNOSIS — H2513 Age-related nuclear cataract, bilateral: Secondary | ICD-10-CM | POA: Diagnosis not present

## 2018-07-25 DIAGNOSIS — H25013 Cortical age-related cataract, bilateral: Secondary | ICD-10-CM | POA: Diagnosis not present

## 2018-07-25 DIAGNOSIS — E119 Type 2 diabetes mellitus without complications: Secondary | ICD-10-CM | POA: Diagnosis not present

## 2018-08-04 DIAGNOSIS — L309 Dermatitis, unspecified: Secondary | ICD-10-CM | POA: Diagnosis not present

## 2018-11-07 ENCOUNTER — Other Ambulatory Visit: Payer: Self-pay

## 2018-11-07 ENCOUNTER — Ambulatory Visit: Payer: Self-pay

## 2018-11-07 VITALS — BP 140/82 | HR 88 | Temp 98.8°F

## 2018-11-07 DIAGNOSIS — Z0130 Encounter for examination of blood pressure without abnormal findings: Secondary | ICD-10-CM

## 2018-11-07 NOTE — Progress Notes (Signed)
   Subjective:    Patient ID: Claire Weaver, female    DOB: 07-04-1945, 73 y.o.   MRN: [id number]  HPI    Review of Systems     Objective:   Physical Exam        Assessment & Plan:  Patient in to have BP checked due to history of hypertension. Patient also voiced concerns related to her diabetic medication. States she has a follow up with her PCP.

## 2018-11-24 DIAGNOSIS — L821 Other seborrheic keratosis: Secondary | ICD-10-CM | POA: Diagnosis not present

## 2018-11-24 DIAGNOSIS — L570 Actinic keratosis: Secondary | ICD-10-CM | POA: Diagnosis not present

## 2018-11-24 DIAGNOSIS — D229 Melanocytic nevi, unspecified: Secondary | ICD-10-CM | POA: Diagnosis not present

## 2018-12-02 IMAGING — US US ABDOMEN LIMITED
1 series · 14 of 25 positions shown · non-contrast
Comparison: 09/04/2013

CLINICAL DATA: Right upper quadrant pain

EXAM:
ULTRASOUND ABDOMEN LIMITED RIGHT UPPER QUADRANT

[Series 1: us abdomen limited · 0.31mm/px · 14 of 30 slices shown]
[im 1/30]
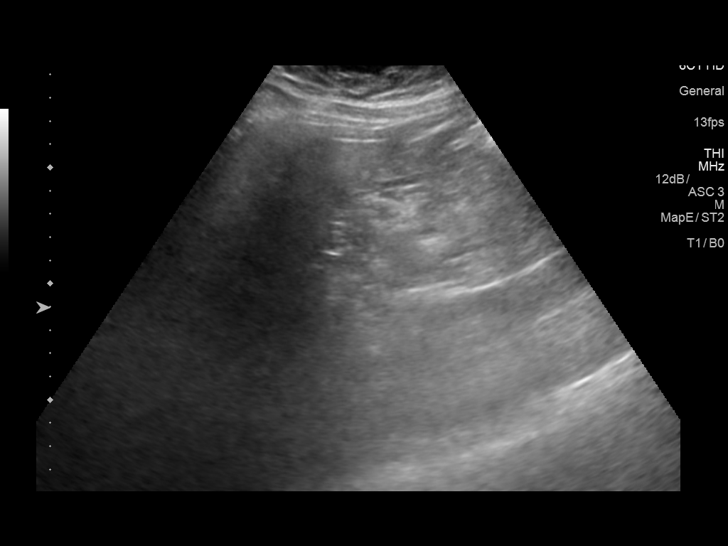
[im 3/30]
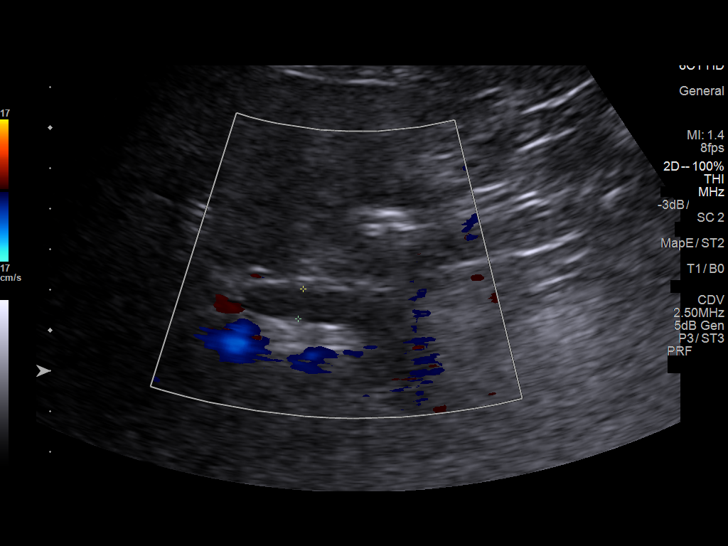
[im 5/30]
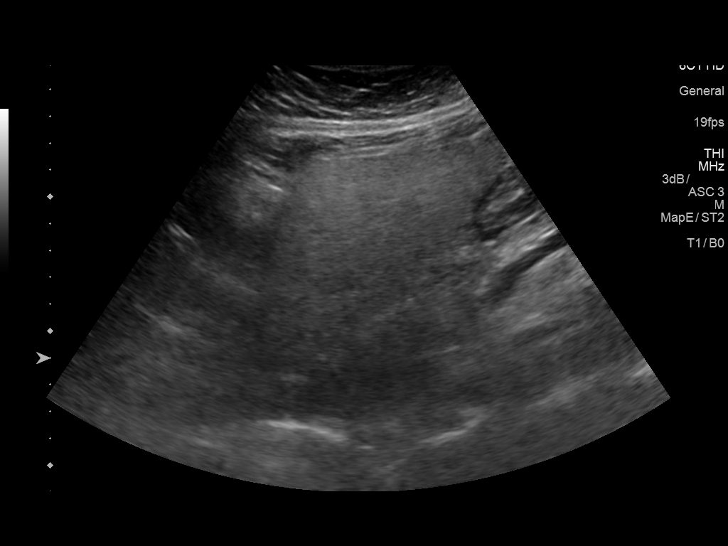
[im 8/30]
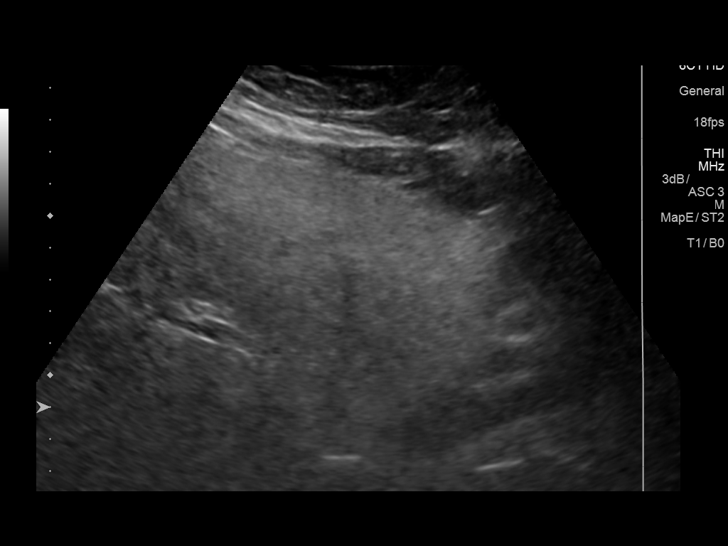
[im 10/30]
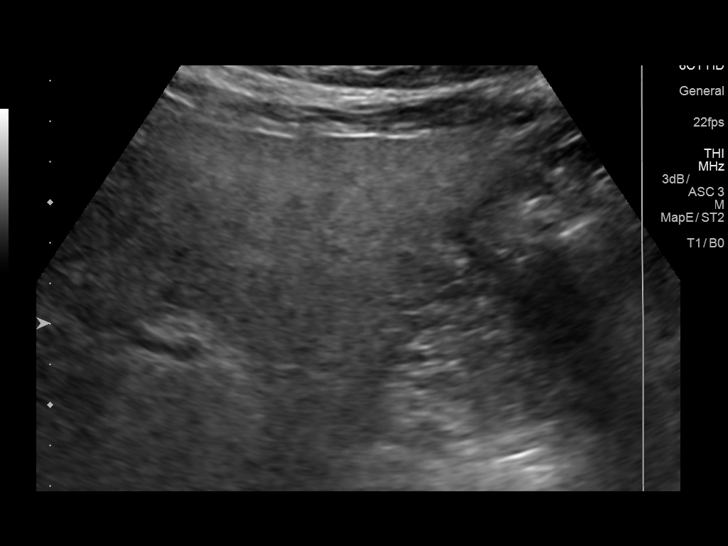
[im 11/30]
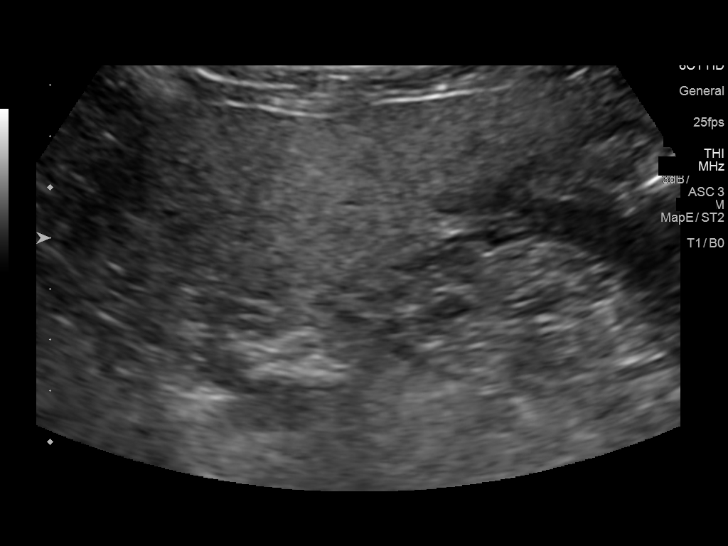
[im 14/30]
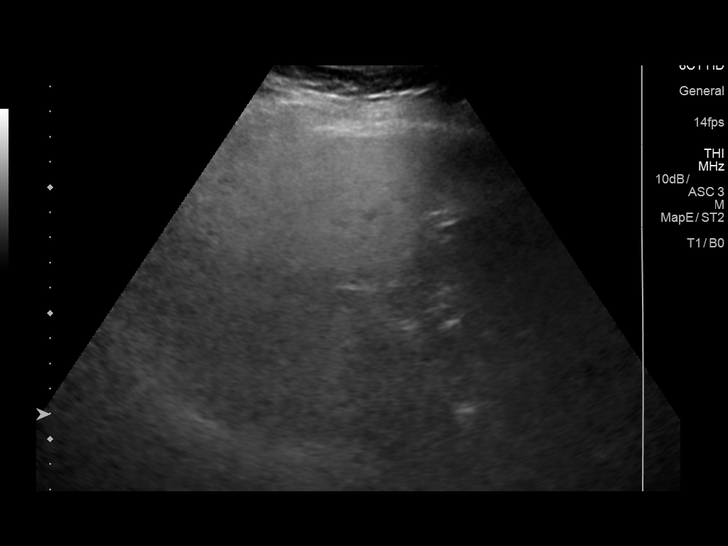
[im 16/30]
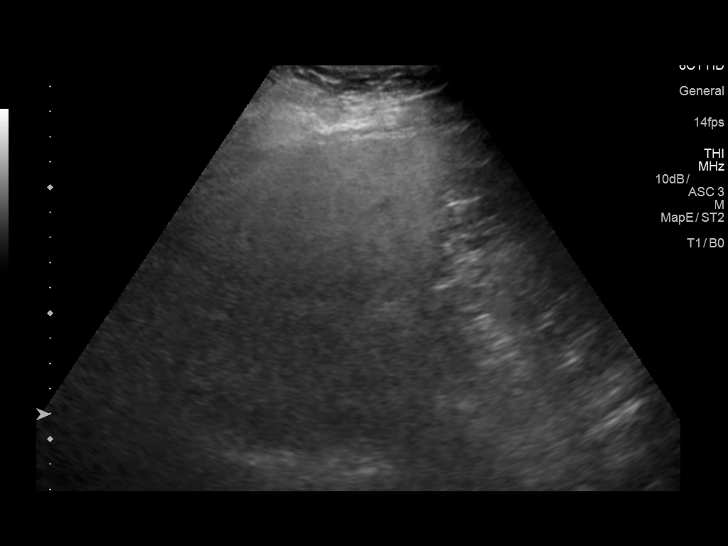
[im 19/30]
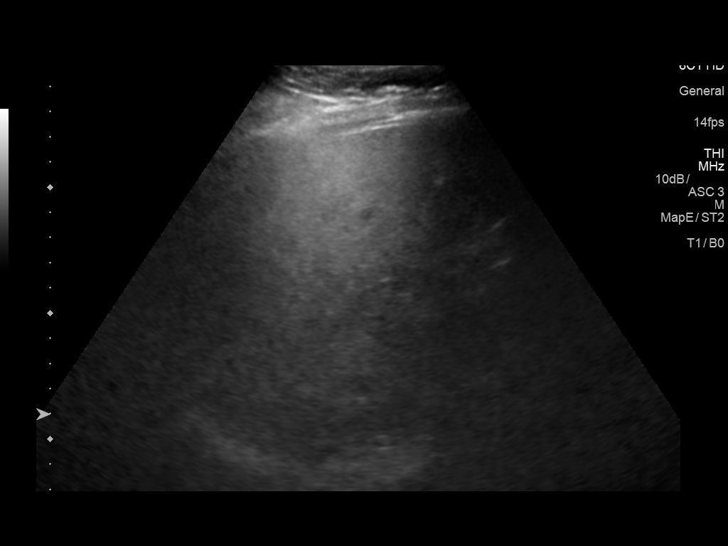
[im 20/30]
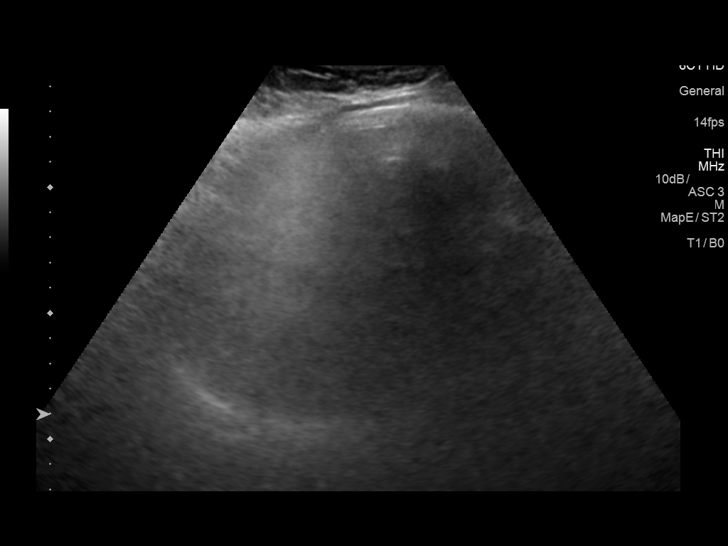
[im 22/30]
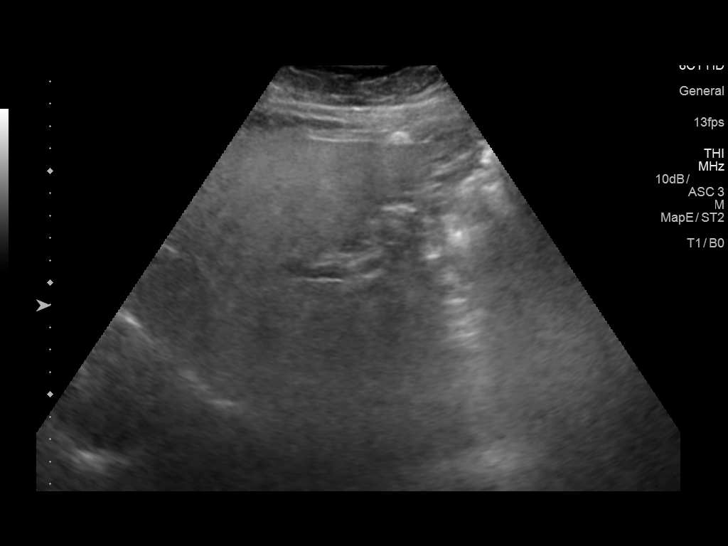
[im 25/30]
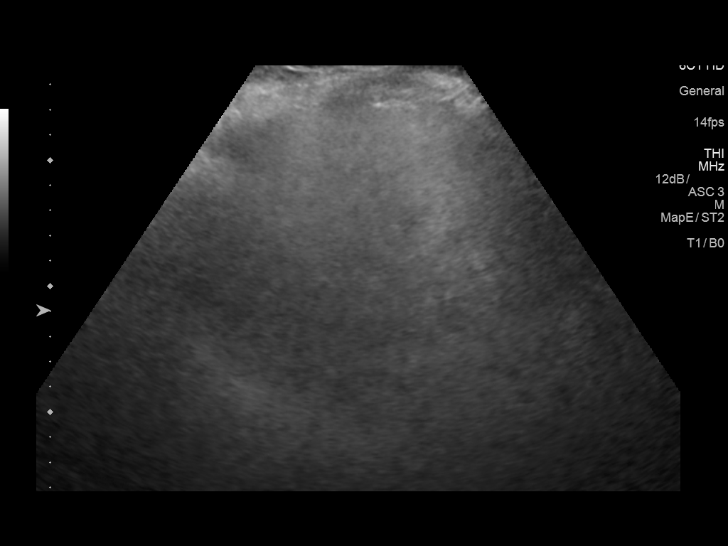
[im 27/30]
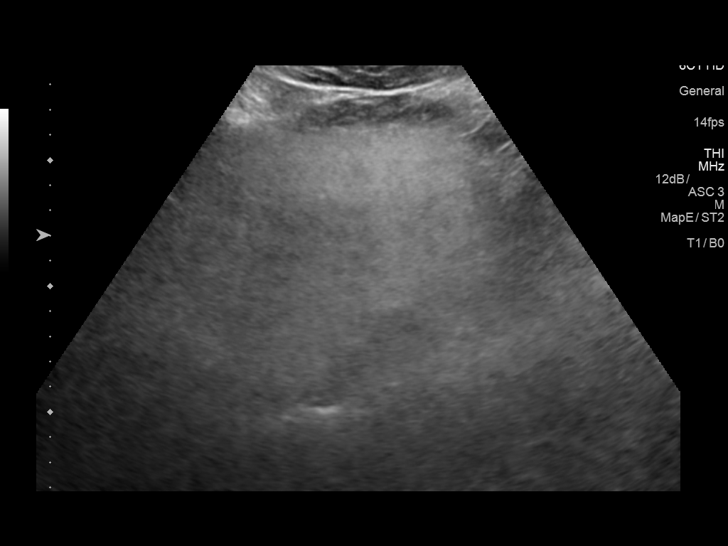
[im 30/30]
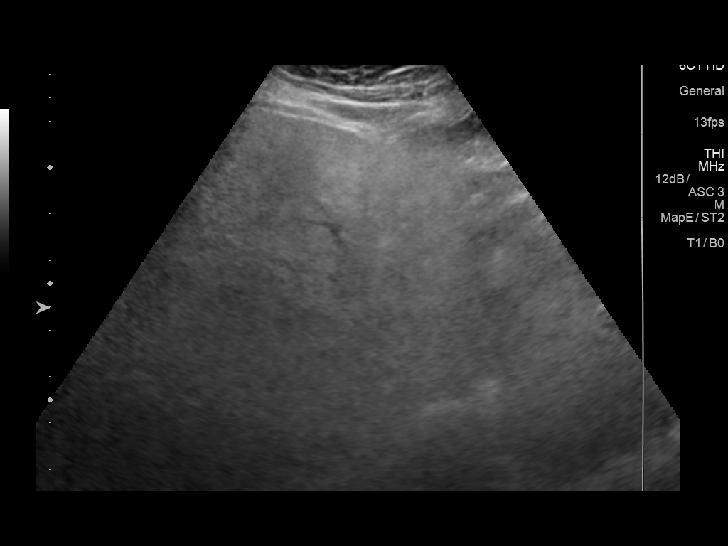

[14 of 25 positions shown; findings below may reference images not displayed]

FINDINGS: Gallbladder:

Surgically removed

Common bile duct:

Diameter: 7.5 mm.

Liver:

Diffuse increased echogenicity is again seen without focal mass
lesion. Portal vein is patent on color Doppler imaging with normal
direction of blood flow towards the liver.
IMPRESSION: Fatty liver stable from the previous exam.

Status post cholecystectomy.

## 2018-12-12 DIAGNOSIS — M5416 Radiculopathy, lumbar region: Secondary | ICD-10-CM | POA: Diagnosis not present

## 2019-01-10 DIAGNOSIS — M5136 Other intervertebral disc degeneration, lumbar region: Secondary | ICD-10-CM | POA: Diagnosis not present

## 2019-01-13 DIAGNOSIS — I10 Essential (primary) hypertension: Secondary | ICD-10-CM | POA: Diagnosis not present

## 2019-01-13 DIAGNOSIS — E559 Vitamin D deficiency, unspecified: Secondary | ICD-10-CM | POA: Diagnosis not present

## 2019-01-13 DIAGNOSIS — E119 Type 2 diabetes mellitus without complications: Secondary | ICD-10-CM | POA: Diagnosis not present

## 2019-01-13 DIAGNOSIS — E785 Hyperlipidemia, unspecified: Secondary | ICD-10-CM | POA: Diagnosis not present

## 2019-04-12 DIAGNOSIS — R253 Fasciculation: Secondary | ICD-10-CM | POA: Diagnosis not present

## 2019-04-25 DIAGNOSIS — M5136 Other intervertebral disc degeneration, lumbar region: Secondary | ICD-10-CM | POA: Diagnosis not present

## 2019-05-17 DIAGNOSIS — Z1231 Encounter for screening mammogram for malignant neoplasm of breast: Secondary | ICD-10-CM | POA: Diagnosis not present

## 2019-06-15 ENCOUNTER — Ambulatory Visit: Payer: PPO | Admitting: Neurology

## 2019-07-17 ENCOUNTER — Ambulatory Visit: Payer: PPO | Attending: Internal Medicine

## 2019-07-17 DIAGNOSIS — Z23 Encounter for immunization: Secondary | ICD-10-CM | POA: Insufficient documentation

## 2019-07-17 NOTE — Progress Notes (Signed)
   Covid-19 Vaccination Clinic  Name:  Claire Weaver    MRN: [id number] DOB: November 11, 1945  07/17/2019  Ms. Pikus was observed post Covid-19 immunization for 15 minutes without incidence. She was provided with Vaccine Information Sheet and instruction to access the V-Safe system.   Ms. Amsler was instructed to call 911 with any severe reactions post vaccine: Marland Kitchen Difficulty breathing  . Swelling of your face and throat  . A fast heartbeat  . A bad rash all over your body  . Dizziness and weakness    Immunizations Administered    Name Date Dose VIS Date Route   Pfizer COVID-19 Vaccine 07/17/2019 12:34 PM 0.3 mL 04/28/2019 Intramuscular   Manufacturer: [place]   Lot: [id number]   [place]: [id number]

## 2019-07-24 ENCOUNTER — Encounter: Payer: Self-pay | Admitting: *Deleted

## 2019-07-24 ENCOUNTER — Other Ambulatory Visit: Payer: Self-pay

## 2019-07-24 ENCOUNTER — Encounter: Payer: Self-pay | Admitting: Neurology

## 2019-07-24 ENCOUNTER — Ambulatory Visit: Payer: PPO | Admitting: Neurology

## 2019-07-24 VITALS — BP 137/79 | HR 74 | Temp 96.0°F | Ht 65.0 in | Wt 191.5 lb

## 2019-07-24 DIAGNOSIS — H9319 Tinnitus, unspecified ear: Secondary | ICD-10-CM | POA: Insufficient documentation

## 2019-07-24 DIAGNOSIS — G250 Essential tremor: Secondary | ICD-10-CM | POA: Insufficient documentation

## 2019-07-24 NOTE — Progress Notes (Signed)
PATIENT: Claire Weaver DOB: [birth year]  Chief Complaint  Patient presents with  . Tremors    Reports tremors present in her bilateral hands and legs. Right side mildly worse than left. She has never tried any medications for her symptoms.   Marland Kitchen PCP    Maurice Small, MD     HISTORICAL  Claire Weaver is a 74 year old female, seen in request by her primary care physician Dr. Justin Mend, Arbie Cookey for evaluation of tremor, initial evaluation was on July 24, 2019.  I have reviewed and summarized the referring note from the referring physician.  She has past medical history of hyperlipidemia, hypertension, diabetes, her mother suffered bilateral hands tremor, progressively worsening her elderly age, also suffered dementia.  She is still physically active, continue to work at [employer], since 2018, she noticed gradual onset bilateral hands tremor, most noticeable when she is using her utensil, or putting on make-up, gradually getting worse over the past 3 years, sometimes with exertion, she felt mild bilateral lower extremity tremors, but she denied persistent sensory loss, weakness, denied gait abnormality, no bowel and bladder incontinence.  In addition, she complains of many years history of bilateral ear loud tinnitus, denies hearing loss, denies vertigo.   REVIEW OF SYSTEMS: Full 14 system review of systems performed and notable only for as above All other review of systems were negative.  ALLERGIES: Allergies  Allergen Reactions  . Shellfish-Derived Products Hives  . Amoxapine And Related Hives  . Atorvastatin Other (See Comments)    Leg cramps  . Bactrim Hives  . Iodinated Diagnostic Agents Hives  . Sulfamethoxazole-Trimethoprim Hives    HOME MEDICATIONS: Current Outpatient Medications  Medication Sig Dispense Refill  . glipiZIDE (GLUCOTROL XL) 5 MG 24 hr tablet Take 1 tablet by mouth in the morning and at bedtime.    Marland Kitchen losartan (COZAAR) 100 MG tablet Take 100 mg by mouth  daily.    . metFORMIN (GLUCOPHAGE-XR) 500 MG 24 hr tablet Take 500 mg by mouth 2 (two) times daily.    . pravastatin (PRAVACHOL) 10 MG tablet Take 5 mg by mouth daily.     Marland Kitchen spironolactone (ALDACTONE) 25 MG tablet Take 25 mg by mouth daily.     No current facility-administered medications for this visit.    PAST MEDICAL HISTORY: Past Medical History:  Diagnosis Date  . Anxiety   . 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 Diabetes mellitus   . Fatty liver   . Hyperlipemia   . Hypertension   . IBS (irritable bowel syndrome)   . Insomnia   . Muscle twitching   . Osteopenia   . Sciatica   . 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 Vitamin D deficiency     PAST SURGICAL HISTORY: Past Surgical History:  Procedure Laterality Date  . ABDOMINAL HYSTERECTOMY    . CHOLECYSTECTOMY    . CYST EXCISION     right wrist    FAMILY HISTORY: Family History  Problem Relation Age of Onset  . Dementia Mother   . Diabetes Mother   . Brain cancer Father     SOCIAL HISTORY: Social History   Socioeconomic History  . Marital status: Divorced    Spouse name: Not on file  . Number of children: 2  . Years of education: 55  . Highest education level:  Not on file  Occupational History  . 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 Smoking status: Never Smoker  . Smokeless tobacco: Never Used  Substance and Sexual Activity  . Alcohol use: No  . Drug use: Never  . Sexual activity: Not on file  Other Topics Concern  . Not on file  Social History Narrative   Lives with son and dgt-in-law.   Right-handed.   Caffeine use: 1 cup coffee, 1 glass tea per day.   Social Determinants of Health   Financial Resource Strain:   . Difficulty of Paying Living Expenses: Not on file  Food Insecurity:   . Worried About Charity fundraiser in the Last Year:  Not on file  . Ran Out of Food in the Last Year: Not on file  Transportation Needs:   . Lack of Transportation (Medical): Not on file  . Lack of Transportation (Non-Medical): Not on file  Physical Activity:   . Days of Exercise per Week: Not on file  . Minutes of Exercise per Session: Not on file  Stress:   . Feeling of Stress : Not on file  Social Connections:   . Frequency of Communication with Friends and Family: Not on file  . Frequency of Social Gatherings with Friends and Family: Not on file  . Attends Religious Services: Not on file  . Active Member of Clubs or Organizations: Not on file  . Attends Archivist Meetings: Not on file  . Marital Status: Not on file  Intimate Partner Violence:   . Fear of Current or Ex-Partner: Not on file  . Emotionally Abused: Not on file  . Physically Abused: Not on file  . Sexually Abused: Not on file     PHYSICAL EXAM   Vitals:   07/24/19 1326  BP: 137/79  Pulse: 74  Temp: (!) 96 F (35.6 C)  Weight: 191 lb 8 oz (86.9 kg)  Height: 5\' 5"  (1.651 m)    Not recorded      Body mass index is 31.87 kg/m.  PHYSICAL EXAMNIATION:  Gen: NAD, conversant, well nourised, well groomed                     Cardiovascular: Regular rate rhythm, no peripheral edema, warm, nontender. Eyes: Conjunctivae clear without exudates or hemorrhage Neck: Supple, no carotid bruits. Pulmonary: Clear to auscultation bilaterally   NEUROLOGICAL EXAM:  MENTAL STATUS: Speech:    Speech is normal; fluent and spontaneous with normal comprehension.  Cognition:     Orientation to time, place and person     Normal recent and remote memory     Normal Attention span and concentration     Normal Language, naming, repeating,spontaneous speech     Fund of knowledge   CRANIAL NERVES: CN II: Visual fields are full to confrontation. Pupils are round equal and briskly reactive to light. CN III, IV, VI: extraocular movement are normal. No ptosis. CN V:  Facial sensation is intact to light touch CN VII: Face is symmetric with normal eye closure  CN VIII: Hearing is normal to causal conversation. CN IX, X: Phonation is normal. CN XI: Head turning and shoulder shrug are intact  MOTOR: Occasionally bilateral hands posturing tremor, normal handwriting, normal drawing of spinal circle, muscle bulk and tone are normal. Muscle strength is normal.  REFLEXES: Reflexes are 2+ and symmetric at the biceps, triceps, knees, and ankles. Plantar responses are flexor.  SENSORY: Intact to light touch,  pinprick and vibratory sensation are intact in fingers and toes.  COORDINATION: There is no trunk or limb dysmetria noted.  GAIT/STANCE: Posture is normal. Gait is steady with normal steps, base, arm swing, and turning.   DIAGNOSTIC DATA (LABS, IMAGING, TESTING) - I reviewed patient records, labs, notes, testing and imaging myself where available.   ASSESSMENT AND PLAN  Claire Weaver is a 74 y.o. female   Essential tremor Bilateral tinnitus  Continue follow-up with her primary care physician, make sure check thyroid functional test at next yearly follow-up,  Only return to clinic for new issues  Marcial Pacas, M.D. Ph.D.  [facility] [address], [place], [place] [address] Ph: [phone] Fax: [phone]  CC: Maurice Small, MD

## 2019-07-26 DIAGNOSIS — Z0000 Encounter for general adult medical examination without abnormal findings: Secondary | ICD-10-CM | POA: Diagnosis not present

## 2019-07-26 DIAGNOSIS — E785 Hyperlipidemia, unspecified: Secondary | ICD-10-CM | POA: Diagnosis not present

## 2019-07-26 DIAGNOSIS — E119 Type 2 diabetes mellitus without complications: Secondary | ICD-10-CM | POA: Diagnosis not present

## 2019-07-26 DIAGNOSIS — E559 Vitamin D deficiency, unspecified: Secondary | ICD-10-CM | POA: Diagnosis not present

## 2019-07-26 DIAGNOSIS — I10 Essential (primary) hypertension: Secondary | ICD-10-CM | POA: Diagnosis not present

## 2019-07-26 DIAGNOSIS — R5382 Chronic fatigue, unspecified: Secondary | ICD-10-CM | POA: Diagnosis not present

## 2019-08-04 DIAGNOSIS — E119 Type 2 diabetes mellitus without complications: Secondary | ICD-10-CM | POA: Diagnosis not present

## 2019-08-04 DIAGNOSIS — H25013 Cortical age-related cataract, bilateral: Secondary | ICD-10-CM | POA: Diagnosis not present

## 2019-08-04 DIAGNOSIS — H2513 Age-related nuclear cataract, bilateral: Secondary | ICD-10-CM | POA: Diagnosis not present

## 2019-08-15 ENCOUNTER — Ambulatory Visit: Payer: PPO | Attending: Internal Medicine

## 2019-08-15 DIAGNOSIS — Z23 Encounter for immunization: Secondary | ICD-10-CM

## 2019-08-15 NOTE — Progress Notes (Signed)
   Covid-19 Vaccination Clinic  Name:  Claire Weaver    MRN: [id number] DOB: Jul 08, 1945  08/15/2019  [name] was observed post Covid-19 immunization for 15 minutes without incident. She was provided with Vaccine Information Sheet and instruction to access the V-Safe system.   [name] was instructed to call 911 with any severe reactions post vaccine: Marland Kitchen Difficulty breathing  . Swelling of face and throat  . A fast heartbeat  . A bad rash all over body  . Dizziness and weakness   Immunizations Administered    Name Date Dose VIS Date Route   Pfizer COVID-19 Vaccine 08/15/2019  3:32 PM 0.3 mL 04/28/2019 Intramuscular   Manufacturer: [place]   Lot: [id number]   [place]: [id number]

## 2019-11-10 ENCOUNTER — Ambulatory Visit: Payer: PPO | Admitting: Physician Assistant

## 2019-11-13 DIAGNOSIS — R0683 Snoring: Secondary | ICD-10-CM | POA: Diagnosis not present

## 2019-11-13 DIAGNOSIS — R591 Generalized enlarged lymph nodes: Secondary | ICD-10-CM | POA: Diagnosis not present

## 2019-11-13 DIAGNOSIS — R5382 Chronic fatigue, unspecified: Secondary | ICD-10-CM | POA: Diagnosis not present

## 2019-11-27 DIAGNOSIS — I10 Essential (primary) hypertension: Secondary | ICD-10-CM | POA: Diagnosis not present

## 2019-12-11 DIAGNOSIS — M5136 Other intervertebral disc degeneration, lumbar region: Secondary | ICD-10-CM | POA: Diagnosis not present

## 2019-12-11 DIAGNOSIS — M5416 Radiculopathy, lumbar region: Secondary | ICD-10-CM | POA: Diagnosis not present

## 2019-12-21 DIAGNOSIS — M5136 Other intervertebral disc degeneration, lumbar region: Secondary | ICD-10-CM | POA: Diagnosis not present

## 2020-01-19 ENCOUNTER — Ambulatory Visit: Payer: PPO | Admitting: Physician Assistant

## 2020-01-26 DIAGNOSIS — Z23 Encounter for immunization: Secondary | ICD-10-CM | POA: Diagnosis not present

## 2020-01-26 DIAGNOSIS — E785 Hyperlipidemia, unspecified: Secondary | ICD-10-CM | POA: Diagnosis not present

## 2020-01-26 DIAGNOSIS — I10 Essential (primary) hypertension: Secondary | ICD-10-CM | POA: Diagnosis not present

## 2020-01-26 DIAGNOSIS — E559 Vitamin D deficiency, unspecified: Secondary | ICD-10-CM | POA: Diagnosis not present

## 2020-01-26 DIAGNOSIS — E1140 Type 2 diabetes mellitus with diabetic neuropathy, unspecified: Secondary | ICD-10-CM | POA: Diagnosis not present

## 2020-03-04 ENCOUNTER — Ambulatory Visit: Payer: PPO | Admitting: Physician Assistant

## 2020-03-11 ENCOUNTER — Ambulatory Visit: Payer: PPO | Admitting: Dermatology

## 2020-04-29 DIAGNOSIS — E1140 Type 2 diabetes mellitus with diabetic neuropathy, unspecified: Secondary | ICD-10-CM | POA: Diagnosis not present

## 2020-04-29 DIAGNOSIS — E785 Hyperlipidemia, unspecified: Secondary | ICD-10-CM | POA: Diagnosis not present

## 2020-04-29 DIAGNOSIS — I10 Essential (primary) hypertension: Secondary | ICD-10-CM | POA: Diagnosis not present

## 2020-04-29 DIAGNOSIS — E559 Vitamin D deficiency, unspecified: Secondary | ICD-10-CM | POA: Diagnosis not present

## 2020-05-13 DIAGNOSIS — Z5181 Encounter for therapeutic drug level monitoring: Secondary | ICD-10-CM | POA: Diagnosis not present

## 2020-05-20 ENCOUNTER — Ambulatory Visit: Payer: PPO | Admitting: Dermatology

## 2020-05-27 DIAGNOSIS — Z1231 Encounter for screening mammogram for malignant neoplasm of breast: Secondary | ICD-10-CM | POA: Diagnosis not present

## 2020-07-17 ENCOUNTER — Ambulatory Visit: Payer: PPO | Admitting: Dermatology

## 2020-08-02 DIAGNOSIS — Z1211 Encounter for screening for malignant neoplasm of colon: Secondary | ICD-10-CM | POA: Diagnosis not present

## 2020-08-02 DIAGNOSIS — E785 Hyperlipidemia, unspecified: Secondary | ICD-10-CM | POA: Diagnosis not present

## 2020-08-02 DIAGNOSIS — Z0000 Encounter for general adult medical examination without abnormal findings: Secondary | ICD-10-CM | POA: Diagnosis not present

## 2020-08-02 DIAGNOSIS — E538 Deficiency of other specified B group vitamins: Secondary | ICD-10-CM | POA: Diagnosis not present

## 2020-08-02 DIAGNOSIS — E559 Vitamin D deficiency, unspecified: Secondary | ICD-10-CM | POA: Diagnosis not present

## 2020-08-02 DIAGNOSIS — F331 Major depressive disorder, recurrent, moderate: Secondary | ICD-10-CM | POA: Diagnosis not present

## 2020-08-02 DIAGNOSIS — R5382 Chronic fatigue, unspecified: Secondary | ICD-10-CM | POA: Diagnosis not present

## 2020-08-02 DIAGNOSIS — E1165 Type 2 diabetes mellitus with hyperglycemia: Secondary | ICD-10-CM | POA: Diagnosis not present

## 2020-08-02 DIAGNOSIS — I10 Essential (primary) hypertension: Secondary | ICD-10-CM | POA: Diagnosis not present

## 2020-08-05 DIAGNOSIS — H2513 Age-related nuclear cataract, bilateral: Secondary | ICD-10-CM | POA: Diagnosis not present

## 2020-08-05 DIAGNOSIS — H25013 Cortical age-related cataract, bilateral: Secondary | ICD-10-CM | POA: Diagnosis not present

## 2020-08-05 DIAGNOSIS — E119 Type 2 diabetes mellitus without complications: Secondary | ICD-10-CM | POA: Diagnosis not present

## 2020-08-13 DIAGNOSIS — M5416 Radiculopathy, lumbar region: Secondary | ICD-10-CM | POA: Diagnosis not present

## 2020-08-19 DIAGNOSIS — E538 Deficiency of other specified B group vitamins: Secondary | ICD-10-CM | POA: Diagnosis not present

## 2020-08-20 DIAGNOSIS — M5136 Other intervertebral disc degeneration, lumbar region: Secondary | ICD-10-CM | POA: Diagnosis not present

## 2020-08-20 DIAGNOSIS — M546 Pain in thoracic spine: Secondary | ICD-10-CM | POA: Diagnosis not present

## 2020-08-28 DIAGNOSIS — E538 Deficiency of other specified B group vitamins: Secondary | ICD-10-CM | POA: Diagnosis not present

## 2020-08-28 DIAGNOSIS — M546 Pain in thoracic spine: Secondary | ICD-10-CM | POA: Diagnosis not present

## 2020-09-06 DIAGNOSIS — Y9226 Movie house or cinema as the place of occurrence of the external cause: Secondary | ICD-10-CM | POA: Diagnosis not present

## 2020-09-06 DIAGNOSIS — E7800 Pure hypercholesterolemia, unspecified: Secondary | ICD-10-CM | POA: Diagnosis not present

## 2020-09-06 DIAGNOSIS — Z882 Allergy status to sulfonamides status: Secondary | ICD-10-CM | POA: Diagnosis not present

## 2020-09-06 DIAGNOSIS — S0512XA Contusion of eyeball and orbital tissues, left eye, initial encounter: Secondary | ICD-10-CM | POA: Diagnosis not present

## 2020-09-06 DIAGNOSIS — E119 Type 2 diabetes mellitus without complications: Secondary | ICD-10-CM | POA: Diagnosis not present

## 2020-09-06 DIAGNOSIS — R609 Edema, unspecified: Secondary | ICD-10-CM | POA: Diagnosis not present

## 2020-09-06 DIAGNOSIS — S7002XA Contusion of left hip, initial encounter: Secondary | ICD-10-CM | POA: Diagnosis not present

## 2020-09-06 DIAGNOSIS — I959 Hypotension, unspecified: Secondary | ICD-10-CM | POA: Diagnosis not present

## 2020-09-06 DIAGNOSIS — W010XXA Fall on same level from slipping, tripping and stumbling without subsequent striking against object, initial encounter: Secondary | ICD-10-CM | POA: Diagnosis not present

## 2020-09-06 DIAGNOSIS — W19XXXA Unspecified fall, initial encounter: Secondary | ICD-10-CM | POA: Diagnosis not present

## 2020-09-06 DIAGNOSIS — S40012A Contusion of left shoulder, initial encounter: Secondary | ICD-10-CM | POA: Diagnosis not present

## 2020-09-06 DIAGNOSIS — W109XXA Fall (on) (from) unspecified stairs and steps, initial encounter: Secondary | ICD-10-CM | POA: Diagnosis not present

## 2020-09-06 DIAGNOSIS — I10 Essential (primary) hypertension: Secondary | ICD-10-CM | POA: Diagnosis not present

## 2020-09-09 DIAGNOSIS — M79644 Pain in right finger(s): Secondary | ICD-10-CM | POA: Diagnosis not present

## 2020-09-09 DIAGNOSIS — M79601 Pain in right arm: Secondary | ICD-10-CM | POA: Diagnosis not present

## 2020-09-16 DIAGNOSIS — W19XXXD Unspecified fall, subsequent encounter: Secondary | ICD-10-CM | POA: Diagnosis not present

## 2020-09-16 DIAGNOSIS — M549 Dorsalgia, unspecified: Secondary | ICD-10-CM | POA: Diagnosis not present

## 2020-09-25 DIAGNOSIS — E1140 Type 2 diabetes mellitus with diabetic neuropathy, unspecified: Secondary | ICD-10-CM | POA: Diagnosis not present

## 2020-09-25 DIAGNOSIS — M25532 Pain in left wrist: Secondary | ICD-10-CM | POA: Diagnosis not present

## 2020-09-25 DIAGNOSIS — M79601 Pain in right arm: Secondary | ICD-10-CM | POA: Diagnosis not present

## 2020-09-25 DIAGNOSIS — M79644 Pain in right finger(s): Secondary | ICD-10-CM | POA: Diagnosis not present

## 2020-09-25 DIAGNOSIS — Z7984 Long term (current) use of oral hypoglycemic drugs: Secondary | ICD-10-CM | POA: Diagnosis not present

## 2020-10-03 ENCOUNTER — Encounter: Payer: Self-pay | Admitting: Physician Assistant

## 2020-10-03 ENCOUNTER — Other Ambulatory Visit: Payer: Self-pay

## 2020-10-03 ENCOUNTER — Ambulatory Visit: Payer: PPO | Admitting: Physician Assistant

## 2020-10-03 DIAGNOSIS — M79641 Pain in right hand: Secondary | ICD-10-CM | POA: Diagnosis not present

## 2020-10-03 DIAGNOSIS — Z86018 Personal history of other benign neoplasm: Secondary | ICD-10-CM | POA: Diagnosis not present

## 2020-10-03 DIAGNOSIS — L820 Inflamed seborrheic keratosis: Secondary | ICD-10-CM

## 2020-10-03 DIAGNOSIS — Z86007 Personal history of in-situ neoplasm of skin: Secondary | ICD-10-CM | POA: Diagnosis not present

## 2020-10-03 DIAGNOSIS — Z1283 Encounter for screening for malignant neoplasm of skin: Secondary | ICD-10-CM | POA: Diagnosis not present

## 2020-10-03 NOTE — Progress Notes (Signed)
   Follow-Up Visit   Subjective  Claire Weaver is a 75 y.o. female who presents for the following: Annual Exam (New lesion on abdomen- ? Sk- also new lesions on feet - ? Sk- Personal history of atypical nevi and non mole skin cancer but denies  family history of melanoma or non mole skin cancer.).   The following portions of the chart were reviewed this encounter and updated as appropriate:  Tobacco  Allergies  Meds  Problems  Med Hx  Surg Hx  Fam Hx      Objective  Well appearing patient in no apparent distress; mood and affect are within normal limits.  A full examination was performed including scalp, head, eyes, ears, nose, lips, neck, chest, axillae, abdomen, back, buttocks, bilateral upper extremities, bilateral lower extremities, hands, feet, fingers, toes, fingernails, and toenails. All findings within normal limits unless otherwise noted below.  Objective  Dorsum of Nose, Left Thigh - Anterior, Right Abdomen (side) - Upper, Right Breast, Right Nasal Sidewall: Dark crusty plaques.   Assessment & Plan  Seborrheic keratosis, inflamed (5) Left Thigh - Anterior; Right Breast; Right Abdomen (side) - Upper; Right Nasal Sidewall; Dorsum of Nose  Destruction of lesion - Dorsum of Nose, Left Thigh - Anterior, Right Abdomen (side) - Upper, Right Breast, Right Nasal Sidewall Complexity: simple   Destruction method: cryotherapy   Informed consent: discussed and consent obtained   Timeout:  patient name, date of birth, surgical site, and procedure verified Lesion destroyed using liquid nitrogen: Yes   Cryotherapy cycles:  3 Outcome: patient tolerated procedure well with no complications      I, Prem Coykendall, PA-C, have reviewed all documentation's for this visit.  The documentation on 10/03/20 for the exam, diagnosis, procedures and orders are all accurate and complete.

## 2020-10-10 DIAGNOSIS — M79644 Pain in right finger(s): Secondary | ICD-10-CM | POA: Diagnosis not present

## 2020-10-10 DIAGNOSIS — M25532 Pain in left wrist: Secondary | ICD-10-CM | POA: Diagnosis not present

## 2020-11-04 DIAGNOSIS — Z1211 Encounter for screening for malignant neoplasm of colon: Secondary | ICD-10-CM | POA: Diagnosis not present

## 2020-11-04 DIAGNOSIS — E785 Hyperlipidemia, unspecified: Secondary | ICD-10-CM | POA: Diagnosis not present

## 2020-11-04 DIAGNOSIS — Z794 Long term (current) use of insulin: Secondary | ICD-10-CM | POA: Diagnosis not present

## 2020-11-04 DIAGNOSIS — E1165 Type 2 diabetes mellitus with hyperglycemia: Secondary | ICD-10-CM | POA: Diagnosis not present

## 2020-11-04 DIAGNOSIS — I10 Essential (primary) hypertension: Secondary | ICD-10-CM | POA: Diagnosis not present

## 2020-11-07 DIAGNOSIS — M13842 Other specified arthritis, left hand: Secondary | ICD-10-CM | POA: Diagnosis not present

## 2020-11-07 DIAGNOSIS — M25512 Pain in left shoulder: Secondary | ICD-10-CM | POA: Diagnosis not present

## 2020-11-07 DIAGNOSIS — M65832 Other synovitis and tenosynovitis, left forearm: Secondary | ICD-10-CM | POA: Diagnosis not present

## 2020-11-07 DIAGNOSIS — M25532 Pain in left wrist: Secondary | ICD-10-CM | POA: Diagnosis not present

## 2020-12-09 DIAGNOSIS — F419 Anxiety disorder, unspecified: Secondary | ICD-10-CM | POA: Diagnosis not present

## 2020-12-09 DIAGNOSIS — I152 Hypertension secondary to endocrine disorders: Secondary | ICD-10-CM | POA: Diagnosis not present

## 2020-12-09 DIAGNOSIS — K219 Gastro-esophageal reflux disease without esophagitis: Secondary | ICD-10-CM | POA: Diagnosis not present

## 2020-12-09 DIAGNOSIS — E1169 Type 2 diabetes mellitus with other specified complication: Secondary | ICD-10-CM | POA: Diagnosis not present

## 2020-12-09 DIAGNOSIS — E1159 Type 2 diabetes mellitus with other circulatory complications: Secondary | ICD-10-CM | POA: Diagnosis not present

## 2020-12-09 DIAGNOSIS — E119 Type 2 diabetes mellitus without complications: Secondary | ICD-10-CM | POA: Diagnosis not present

## 2020-12-09 DIAGNOSIS — Z79899 Other long term (current) drug therapy: Secondary | ICD-10-CM | POA: Diagnosis not present

## 2020-12-09 DIAGNOSIS — G2581 Restless legs syndrome: Secondary | ICD-10-CM | POA: Diagnosis not present

## 2020-12-09 DIAGNOSIS — E785 Hyperlipidemia, unspecified: Secondary | ICD-10-CM | POA: Diagnosis not present

## 2021-01-03 DIAGNOSIS — M79671 Pain in right foot: Secondary | ICD-10-CM | POA: Diagnosis not present

## 2021-01-03 DIAGNOSIS — E119 Type 2 diabetes mellitus without complications: Secondary | ICD-10-CM | POA: Diagnosis not present

## 2021-01-03 DIAGNOSIS — M79641 Pain in right hand: Secondary | ICD-10-CM | POA: Diagnosis not present

## 2021-03-14 DIAGNOSIS — Z79899 Other long term (current) drug therapy: Secondary | ICD-10-CM | POA: Diagnosis not present

## 2021-03-14 DIAGNOSIS — E785 Hyperlipidemia, unspecified: Secondary | ICD-10-CM | POA: Diagnosis not present

## 2021-03-14 DIAGNOSIS — I152 Hypertension secondary to endocrine disorders: Secondary | ICD-10-CM | POA: Diagnosis not present

## 2021-03-14 DIAGNOSIS — E1159 Type 2 diabetes mellitus with other circulatory complications: Secondary | ICD-10-CM | POA: Diagnosis not present

## 2021-03-14 DIAGNOSIS — E1169 Type 2 diabetes mellitus with other specified complication: Secondary | ICD-10-CM | POA: Diagnosis not present

## 2021-03-21 DIAGNOSIS — Z1331 Encounter for screening for depression: Secondary | ICD-10-CM | POA: Diagnosis not present

## 2021-03-21 DIAGNOSIS — Z0000 Encounter for general adult medical examination without abnormal findings: Secondary | ICD-10-CM | POA: Diagnosis not present

## 2021-03-21 DIAGNOSIS — E1142 Type 2 diabetes mellitus with diabetic polyneuropathy: Secondary | ICD-10-CM | POA: Diagnosis not present

## 2021-04-30 ENCOUNTER — Ambulatory Visit: Payer: PPO | Admitting: Physician Assistant

## 2021-05-08 DIAGNOSIS — M7050 Other bursitis of knee, unspecified knee: Secondary | ICD-10-CM | POA: Diagnosis not present

## 2021-05-08 DIAGNOSIS — G2581 Restless legs syndrome: Secondary | ICD-10-CM | POA: Diagnosis not present

## 2021-07-28 DIAGNOSIS — N644 Mastodynia: Secondary | ICD-10-CM | POA: Diagnosis not present

## 2021-08-26 DIAGNOSIS — F419 Anxiety disorder, unspecified: Secondary | ICD-10-CM | POA: Diagnosis not present

## 2021-08-26 DIAGNOSIS — Z79899 Other long term (current) drug therapy: Secondary | ICD-10-CM | POA: Diagnosis not present

## 2021-08-26 DIAGNOSIS — E785 Hyperlipidemia, unspecified: Secondary | ICD-10-CM | POA: Diagnosis not present

## 2021-08-26 DIAGNOSIS — M2550 Pain in unspecified joint: Secondary | ICD-10-CM | POA: Diagnosis not present

## 2021-08-26 DIAGNOSIS — I152 Hypertension secondary to endocrine disorders: Secondary | ICD-10-CM | POA: Diagnosis not present

## 2021-08-26 DIAGNOSIS — E1169 Type 2 diabetes mellitus with other specified complication: Secondary | ICD-10-CM | POA: Diagnosis not present

## 2021-08-26 DIAGNOSIS — E1159 Type 2 diabetes mellitus with other circulatory complications: Secondary | ICD-10-CM | POA: Diagnosis not present

## 2021-08-26 DIAGNOSIS — E119 Type 2 diabetes mellitus without complications: Secondary | ICD-10-CM | POA: Diagnosis not present

## 2021-08-26 DIAGNOSIS — G2581 Restless legs syndrome: Secondary | ICD-10-CM | POA: Diagnosis not present

## 2021-09-09 ENCOUNTER — Ambulatory Visit: Payer: PPO | Admitting: Physician Assistant

## 2021-09-09 ENCOUNTER — Encounter: Payer: Self-pay | Admitting: Physician Assistant

## 2021-09-09 DIAGNOSIS — Z85828 Personal history of other malignant neoplasm of skin: Secondary | ICD-10-CM

## 2021-09-09 DIAGNOSIS — L570 Actinic keratosis: Secondary | ICD-10-CM

## 2021-09-09 DIAGNOSIS — L820 Inflamed seborrheic keratosis: Secondary | ICD-10-CM | POA: Diagnosis not present

## 2021-09-09 DIAGNOSIS — Z86018 Personal history of other benign neoplasm: Secondary | ICD-10-CM | POA: Diagnosis not present

## 2021-09-09 DIAGNOSIS — Z1283 Encounter for screening for malignant neoplasm of skin: Secondary | ICD-10-CM

## 2021-09-09 NOTE — Progress Notes (Signed)
? ?  Follow-Up Visit ?  ?Subjective  ?Claire Weaver is a 76 y.o. female who presents for the following: Annual Exam (Here for annual skin exam. Concerns neck area. Itchy lesions. Also lower legs history of skin cancers. Patient has some keratosis like lesions around her foot wants to know if there is anything to do for them. History of atypical moles.). ? ? ?The following portions of the chart were reviewed this encounter and updated as appropriate:  Tobacco  Allergies  Meds  Problems  Med Hx  Surg Hx  Fam Hx   ?  ? ?Objective  ?Well appearing patient in no apparent distress; mood and affect are within normal limits. ? ?A full examination was performed including scalp, head, eyes, ears, nose, lips, neck, chest, axillae, abdomen, back, buttocks, bilateral upper extremities, bilateral lower extremities, hands, feet, fingers, toes, fingernails, and toenails. All findings within normal limits unless otherwise noted below. ? ?Head - Anterior (Face), Left Parotid Area, Right Nasal Sidewall (8), Right Parotid Area ?Erythematous patches with gritty scale. ? ?Left Foot - Anterior (10), Right Foot - Anterior (10), Right Lower Leg - Anterior ? Brown crusted plaque on erythematous base. ? ? ?Assessment & Plan  ?AK (actinic keratosis) (11) ?Head - Anterior (Face); Right Nasal Sidewall (8); Left Parotid Area; Right Parotid Area ? ?Destruction of lesion - Head - Anterior (Face), Left Parotid Area, Right Nasal Sidewall, Right Parotid Area ?Complexity: simple   ?Destruction method: cryotherapy   ?Informed consent: discussed and consent obtained   ?Timeout:  patient name, date of birth, surgical site, and procedure verified ?Lesion destroyed using liquid nitrogen: Yes   ?Cryotherapy cycles:  1 ?Outcome: patient tolerated procedure well with no complications   ?Post-procedure details: wound care instructions given   ? ?Inflamed seborrheic keratosis (21) ?Left Foot - Anterior (10); Right Foot - Anterior (10); Right Lower Leg -  Anterior ? ?Destruction of lesion - Left Foot - Anterior, Right Foot - Anterior ?Complexity: simple   ?Destruction method: cryotherapy   ?Informed consent: discussed and consent obtained   ?Timeout:  patient name, date of birth, surgical site, and procedure verified ?Lesion destroyed using liquid nitrogen: Yes   ?Cryotherapy cycles:  1 ?Outcome: patient tolerated procedure well with no complications   ?Post-procedure details: wound care instructions given   ? ? ? ?I, Kieren Ricci, PA-C, have reviewed all documentation's for this visit.  The documentation on 09/09/21 for the exam, diagnosis, procedures and orders are all accurate and complete. ?

## 2021-09-19 DIAGNOSIS — G2581 Restless legs syndrome: Secondary | ICD-10-CM | POA: Diagnosis not present

## 2021-09-19 DIAGNOSIS — E119 Type 2 diabetes mellitus without complications: Secondary | ICD-10-CM | POA: Diagnosis not present

## 2021-09-19 DIAGNOSIS — E7800 Pure hypercholesterolemia, unspecified: Secondary | ICD-10-CM | POA: Diagnosis not present

## 2021-09-19 DIAGNOSIS — F419 Anxiety disorder, unspecified: Secondary | ICD-10-CM | POA: Diagnosis not present

## 2021-09-19 DIAGNOSIS — I10 Essential (primary) hypertension: Secondary | ICD-10-CM | POA: Diagnosis not present

## 2021-09-19 DIAGNOSIS — Z79899 Other long term (current) drug therapy: Secondary | ICD-10-CM | POA: Diagnosis not present

## 2021-11-05 DIAGNOSIS — M5459 Other low back pain: Secondary | ICD-10-CM | POA: Diagnosis not present

## 2021-11-05 DIAGNOSIS — M4316 Spondylolisthesis, lumbar region: Secondary | ICD-10-CM | POA: Diagnosis not present

## 2021-11-15 DIAGNOSIS — M5459 Other low back pain: Secondary | ICD-10-CM | POA: Diagnosis not present

## 2021-12-08 DIAGNOSIS — I10 Essential (primary) hypertension: Secondary | ICD-10-CM | POA: Diagnosis not present

## 2021-12-08 DIAGNOSIS — E119 Type 2 diabetes mellitus without complications: Secondary | ICD-10-CM | POA: Diagnosis not present

## 2021-12-13 DIAGNOSIS — M5416 Radiculopathy, lumbar region: Secondary | ICD-10-CM | POA: Diagnosis not present

## 2022-01-07 DIAGNOSIS — I10 Essential (primary) hypertension: Secondary | ICD-10-CM | POA: Diagnosis not present

## 2022-01-07 DIAGNOSIS — Y92009 Unspecified place in unspecified non-institutional (private) residence as the place of occurrence of the external cause: Secondary | ICD-10-CM | POA: Diagnosis not present

## 2022-01-07 DIAGNOSIS — W19XXXA Unspecified fall, initial encounter: Secondary | ICD-10-CM | POA: Diagnosis not present

## 2022-01-07 DIAGNOSIS — R42 Dizziness and giddiness: Secondary | ICD-10-CM | POA: Diagnosis not present

## 2022-01-07 DIAGNOSIS — E119 Type 2 diabetes mellitus without complications: Secondary | ICD-10-CM | POA: Diagnosis not present

## 2022-01-29 DIAGNOSIS — M5416 Radiculopathy, lumbar region: Secondary | ICD-10-CM | POA: Diagnosis not present

## 2022-02-16 DIAGNOSIS — H2513 Age-related nuclear cataract, bilateral: Secondary | ICD-10-CM | POA: Diagnosis not present

## 2022-02-16 DIAGNOSIS — H25013 Cortical age-related cataract, bilateral: Secondary | ICD-10-CM | POA: Diagnosis not present

## 2022-02-16 DIAGNOSIS — H40033 Anatomical narrow angle, bilateral: Secondary | ICD-10-CM | POA: Diagnosis not present

## 2022-02-16 DIAGNOSIS — H5203 Hypermetropia, bilateral: Secondary | ICD-10-CM | POA: Diagnosis not present

## 2022-02-16 DIAGNOSIS — E119 Type 2 diabetes mellitus without complications: Secondary | ICD-10-CM | POA: Diagnosis not present

## 2022-03-20 DIAGNOSIS — Z79899 Other long term (current) drug therapy: Secondary | ICD-10-CM | POA: Diagnosis not present

## 2022-03-20 DIAGNOSIS — E7800 Pure hypercholesterolemia, unspecified: Secondary | ICD-10-CM | POA: Diagnosis not present

## 2022-03-20 DIAGNOSIS — E119 Type 2 diabetes mellitus without complications: Secondary | ICD-10-CM | POA: Diagnosis not present

## 2022-03-27 DIAGNOSIS — Z79899 Other long term (current) drug therapy: Secondary | ICD-10-CM | POA: Diagnosis not present

## 2022-03-27 DIAGNOSIS — Z0000 Encounter for general adult medical examination without abnormal findings: Secondary | ICD-10-CM | POA: Diagnosis not present

## 2022-03-27 DIAGNOSIS — Z1331 Encounter for screening for depression: Secondary | ICD-10-CM | POA: Diagnosis not present

## 2022-03-27 DIAGNOSIS — Z780 Asymptomatic menopausal state: Secondary | ICD-10-CM | POA: Diagnosis not present

## 2022-04-07 DIAGNOSIS — L989 Disorder of the skin and subcutaneous tissue, unspecified: Secondary | ICD-10-CM | POA: Diagnosis not present

## 2022-04-14 DIAGNOSIS — L02821 Furuncle of head [any part, except face]: Secondary | ICD-10-CM | POA: Diagnosis not present

## 2022-04-14 DIAGNOSIS — D1801 Hemangioma of skin and subcutaneous tissue: Secondary | ICD-10-CM | POA: Diagnosis not present

## 2022-04-14 DIAGNOSIS — L814 Other melanin hyperpigmentation: Secondary | ICD-10-CM | POA: Diagnosis not present

## 2022-04-14 DIAGNOSIS — L821 Other seborrheic keratosis: Secondary | ICD-10-CM | POA: Diagnosis not present

## 2022-04-17 DIAGNOSIS — M85851 Other specified disorders of bone density and structure, right thigh: Secondary | ICD-10-CM | POA: Diagnosis not present

## 2022-04-17 DIAGNOSIS — Z780 Asymptomatic menopausal state: Secondary | ICD-10-CM | POA: Diagnosis not present

## 2022-05-04 DIAGNOSIS — Z6829 Body mass index (BMI) 29.0-29.9, adult: Secondary | ICD-10-CM | POA: Diagnosis not present

## 2022-05-04 DIAGNOSIS — J069 Acute upper respiratory infection, unspecified: Secondary | ICD-10-CM | POA: Diagnosis not present

## 2022-05-04 DIAGNOSIS — Z789 Other specified health status: Secondary | ICD-10-CM | POA: Diagnosis not present

## 2022-05-04 DIAGNOSIS — I10 Essential (primary) hypertension: Secondary | ICD-10-CM | POA: Diagnosis not present

## 2022-05-04 DIAGNOSIS — R051 Acute cough: Secondary | ICD-10-CM | POA: Diagnosis not present

## 2022-06-01 DIAGNOSIS — Z23 Encounter for immunization: Secondary | ICD-10-CM | POA: Diagnosis not present

## 2022-06-02 DIAGNOSIS — L02821 Furuncle of head [any part, except face]: Secondary | ICD-10-CM | POA: Diagnosis not present

## 2022-06-29 DIAGNOSIS — Z1211 Encounter for screening for malignant neoplasm of colon: Secondary | ICD-10-CM | POA: Diagnosis not present

## 2022-06-29 DIAGNOSIS — Z9181 History of falling: Secondary | ICD-10-CM | POA: Diagnosis not present

## 2022-06-29 DIAGNOSIS — R413 Other amnesia: Secondary | ICD-10-CM | POA: Diagnosis not present

## 2022-06-29 DIAGNOSIS — K760 Fatty (change of) liver, not elsewhere classified: Secondary | ICD-10-CM | POA: Diagnosis not present

## 2022-06-29 DIAGNOSIS — Z0000 Encounter for general adult medical examination without abnormal findings: Secondary | ICD-10-CM | POA: Diagnosis not present

## 2022-06-29 DIAGNOSIS — G8929 Other chronic pain: Secondary | ICD-10-CM | POA: Diagnosis not present

## 2022-06-29 DIAGNOSIS — E1165 Type 2 diabetes mellitus with hyperglycemia: Secondary | ICD-10-CM | POA: Diagnosis not present

## 2022-06-29 DIAGNOSIS — E559 Vitamin D deficiency, unspecified: Secondary | ICD-10-CM | POA: Diagnosis not present

## 2022-06-29 DIAGNOSIS — E785 Hyperlipidemia, unspecified: Secondary | ICD-10-CM | POA: Diagnosis not present

## 2022-06-29 DIAGNOSIS — M549 Dorsalgia, unspecified: Secondary | ICD-10-CM | POA: Diagnosis not present

## 2022-06-29 DIAGNOSIS — I10 Essential (primary) hypertension: Secondary | ICD-10-CM | POA: Diagnosis not present

## 2022-06-29 DIAGNOSIS — R251 Tremor, unspecified: Secondary | ICD-10-CM | POA: Diagnosis not present

## 2022-07-02 DIAGNOSIS — E1165 Type 2 diabetes mellitus with hyperglycemia: Secondary | ICD-10-CM | POA: Diagnosis not present

## 2022-07-02 DIAGNOSIS — E785 Hyperlipidemia, unspecified: Secondary | ICD-10-CM | POA: Diagnosis not present

## 2022-07-02 DIAGNOSIS — E559 Vitamin D deficiency, unspecified: Secondary | ICD-10-CM | POA: Diagnosis not present

## 2022-07-02 DIAGNOSIS — I10 Essential (primary) hypertension: Secondary | ICD-10-CM | POA: Diagnosis not present

## 2022-07-03 DIAGNOSIS — E1165 Type 2 diabetes mellitus with hyperglycemia: Secondary | ICD-10-CM | POA: Diagnosis not present

## 2022-07-07 DIAGNOSIS — L02821 Furuncle of head [any part, except face]: Secondary | ICD-10-CM | POA: Diagnosis not present

## 2022-07-10 DIAGNOSIS — Z1211 Encounter for screening for malignant neoplasm of colon: Secondary | ICD-10-CM | POA: Diagnosis not present

## 2022-07-14 ENCOUNTER — Telehealth: Payer: Self-pay

## 2022-07-14 NOTE — Telephone Encounter (Signed)
We received a call from this patient regarding her appointment. She was scheduled with Dr Krista Blue for memory changes and fam hx of dementia. She called to ask if she can transfer her care from Dr Krista Blue to Dr Brett Fairy. She last saw Dr Krista Blue prior to this in 2021.  Please advise.

## 2022-07-17 ENCOUNTER — Other Ambulatory Visit: Payer: Self-pay | Admitting: Family Medicine

## 2022-07-17 ENCOUNTER — Ambulatory Visit
Admission: RE | Admit: 2022-07-17 | Discharge: 2022-07-17 | Disposition: A | Discharge status: Home / Self Care | Payer: HMO | Source: Ambulatory Visit | Attending: Family Medicine | Admitting: Family Medicine

## 2022-07-17 DIAGNOSIS — H9312 Tinnitus, left ear: Secondary | ICD-10-CM | POA: Diagnosis not present

## 2022-07-17 DIAGNOSIS — H9190 Unspecified hearing loss, unspecified ear: Secondary | ICD-10-CM | POA: Diagnosis not present

## 2022-07-17 DIAGNOSIS — R222 Localized swelling, mass and lump, trunk: Secondary | ICD-10-CM

## 2022-07-21 ENCOUNTER — Other Ambulatory Visit: Payer: Self-pay | Admitting: Family Medicine

## 2022-07-21 DIAGNOSIS — R222 Localized swelling, mass and lump, trunk: Secondary | ICD-10-CM

## 2022-07-30 DIAGNOSIS — Z1231 Encounter for screening mammogram for malignant neoplasm of breast: Secondary | ICD-10-CM | POA: Diagnosis not present

## 2022-08-07 ENCOUNTER — Other Ambulatory Visit: Payer: HMO

## 2022-08-31 DIAGNOSIS — E1140 Type 2 diabetes mellitus with diabetic neuropathy, unspecified: Secondary | ICD-10-CM | POA: Diagnosis not present

## 2022-08-31 DIAGNOSIS — E11621 Type 2 diabetes mellitus with foot ulcer: Secondary | ICD-10-CM | POA: Diagnosis not present

## 2022-08-31 DIAGNOSIS — M79672 Pain in left foot: Secondary | ICD-10-CM | POA: Diagnosis not present

## 2022-08-31 DIAGNOSIS — L97421 Non-pressure chronic ulcer of left heel and midfoot limited to breakdown of skin: Secondary | ICD-10-CM | POA: Diagnosis not present

## 2022-09-01 ENCOUNTER — Ambulatory Visit
Admission: RE | Admit: 2022-09-01 | Discharge: 2022-09-01 | Disposition: A | Discharge status: Home / Self Care | Payer: HMO | Source: Ambulatory Visit | Attending: Family Medicine | Admitting: Family Medicine

## 2022-09-01 DIAGNOSIS — R222 Localized swelling, mass and lump, trunk: Secondary | ICD-10-CM

## 2022-09-03 ENCOUNTER — Institutional Professional Consult (permissible substitution): Payer: PPO | Admitting: Neurology

## 2022-09-14 ENCOUNTER — Ambulatory Visit (INDEPENDENT_AMBULATORY_CARE_PROVIDER_SITE_OTHER): Payer: HMO | Admitting: Neurology

## 2022-09-14 VITALS — BP 139/68 | HR 60 | Ht 65.0 in | Wt 180.0 lb

## 2022-09-14 DIAGNOSIS — R400 Somnolence: Secondary | ICD-10-CM | POA: Diagnosis not present

## 2022-09-14 DIAGNOSIS — R4189 Other symptoms and signs involving cognitive functions and awareness: Secondary | ICD-10-CM | POA: Diagnosis not present

## 2022-09-14 NOTE — Patient Instructions (Signed)
There are well-accepted and sensible ways to reduce risk for Alzheimers disease and other degenerative brain disorders .  Exercise Daily Walk A daily 20 minute walk should be part of your routine. Disease related apathy can be a significant roadblock to exercise and the only way to overcome this is to make it a daily routine and perhaps have a reward at the end (something your loved one loves to eat or drink perhaps) or a personal trainer coming to the home can also be very useful. Most importantly, the patient is much more likely to exercise if the caregiver / spouse does it with him/her. In general a structured, repetitive schedule is best.  General Health: Any diseases which effect your body will effect your brain such as a pneumonia, urinary infection, blood clot, heart attack or stroke. Keep contact with your primary care doctor for regular follow ups.  Sleep. A good nights sleep is healthy for the brain. Seven hours is recommended. If you have insomnia or poor sleep habits we can give you some instructions. If you have sleep apnea wear your mask.  Diet: Eating a heart healthy diet is also a good idea; fish and poultry instead of red meat, nuts (mostly non-peanuts), vegetables, fruits, olive oil or canola oil (instead of butter), minimal salt (use other spices to flavor foods), whole grain rice, bread, cereal and pasta and wine in moderation.Research is now showing that the MIND diet, which is a combination of The Mediterranean diet and the DASH diet, is beneficial for cognitive processing and longevity. Information about this diet can be found in The MIND Diet, a book by Maggie Moon, MS, RDN, and online at https://www.healthline.com/nutrition/mind-diet  Finances, Power of Attorney and Advance Directives: You should consider putting legal safeguards in place with regard to financial and medical decision making. While the spouse always has power of attorney for medical and financial issues in the  absence of any form, you should consider what you want in case the spouse / caregiver is no longer around or capable of making decisions.   The Alzheimers Association Position on Disease Prevention  Can Alzheimer's be prevented? It's a question that continues to intrigue researchers and fuel new investigations. There are no clear-cut answers yet -- partially due to the need for more large-scale studies in diverse populations -- but promising research is under way. The Alzheimer's Association is leading the worldwide effort to find a treatment for Alzheimer's, delay its onset and prevent it from developing.   What causes Alzheimer's? Experts agree that in the vast majority of cases, Alzheimer's, like other common chronic conditions, probably develops as a result of complex interactions among multiple factors, including age, genetics, environment, lifestyle and coexisting medical conditions. Although some risk factors -- such as age or genes -- cannot be changed, other risk factors -- such as high blood pressure and lack of exercise -- usually can be changed to help reduce risk. Research in these areas may lead to new ways to detect those at highest risk.  Prevention studies A small percentage of people with Alzheimer's disease (less than 1 percent) have an early-onset type associated with genetic mutations. Individuals who have these genetic mutations are guaranteed to develop the disease. An ongoing clinical trial conducted by the [facility] ([facility]), is testing whether antibodies to beta-amyloid can reduce the accumulation of beta-amyloid plaque in the brains of people with such genetic mutations and thereby reduce, delay or prevent symptoms. Participants in the trial are receiving antibodies (  or placebo) before they develop symptoms, and the development of beta-amyloid plaques is being monitored by brain scans and other tests.  Another clinical trial, known as the A4 trial  (Anti-Amyloid Treatment in Asymptomatic Alzheimer's), is testing whether antibodies to beta-amyloid can reduce the risk of Alzheimer's disease in older people (ages 65 to 85) at high risk for the disease. The A4 trial is being conducted by the [facility]'s [facility].  Though research is still evolving, evidence is strong that people can reduce their risk by making key lifestyle changes, including participating in regular activity and maintaining good heart health. Based on this research, the Alzheimer's Association offers 10 Ways to Love Your Brain -- a collection of tips that can reduce the risk of cognitive decline.  Heart-head connection  New research shows there are things we can do to reduce the risk of mild cognitive impairment and dementia.  Several conditions known to increase the risk of cardiovascular disease -- such as high blood pressure, diabetes and high cholesterol -- also increase the risk of developing Alzheimer's. Some autopsy studies show that as many as 80 percent of individuals with Alzheimer's disease also have cardiovascular disease.  A longstanding question is why some people develop hallmark Alzheimer's plaques and tangles but do not develop the symptoms of Alzheimer's. Vascular disease may help researchers eventually find an answer. Some autopsy studies suggest that plaques and tangles may be present in the brain without causing symptoms of cognitive decline unless the brain also shows evidence of vascular disease. More research is needed to better understand the link between vascular health and Alzheimer's.  Physical exercise and diet Regular physical exercise may be a beneficial strategy to lower the risk of Alzheimer's and vascular dementia. Exercise may directly benefit brain cells by increasing blood and oxygen flow in the brain. Because of its known cardiovascular benefits, a medically approved exercise program is a valuable part of any overall wellness  plan.  Current evidence suggests that heart-healthy eating may also help protect the brain. Heart-healthy eating includes limiting the intake of sugar and saturated fats and making sure to eat plenty of fruits, vegetables, and whole grains. No one diet is best. Two diets that have been studied and may be beneficial are the DASH (Dietary Approaches to Stop Hypertension) diet and the Mediterranean diet. The DASH diet emphasizes vegetables, fruits and fat-free or low-fat dairy products; includes whole grains, fish, poultry, beans, seeds, nuts and vegetable oils; and limits sodium, sweets, sugary beverages and red meats. A Mediterranean diet includes relatively little red meat and emphasizes whole grains, fruits and vegetables, fish and shellfish, and nuts, olive oil and other healthy fats.  Social connections and intellectual activity A number of studies indicate that maintaining strong social connections and keeping mentally active as we age might lower the risk of cognitive decline and Alzheimer's. Experts are not certain about the reason for this association. It may be due to direct mechanisms through which social and mental stimulation strengthen connections between nerve cells in the brain.  Head trauma There appears to be a strong link between future risk of Alzheimer's and serious head trauma, especially when injury involves loss of consciousness. You can help reduce your risk of Alzheimer's by protecting your head.  Wear a seat belt  Use a helmet when participating in sports  "Fall-proof" your home   What you can do now While research is not yet conclusive, certain lifestyle choices, such as physical activity and diet, may help support brain   health and prevent Alzheimer's. Many of these lifestyle changes have been shown to lower the risk of other diseases, like heart disease and diabetes, which have been linked to Alzheimer's. With few drawbacks and plenty of known benefits, healthy lifestyle  choices can improve your health and possibly protect your brain.  Learn more about brain health. You can help increase our knowledge by considering participation in a clinical study. Our free clinical trial matching services, Trial[employer], can help you find clinical trials in your area that are seeking volunteers.  Understanding prevention research Here are some things to keep in mind about the research underlying much of our current knowledge about possible prevention:  Insights about potentially modifiable risk factors apply to large population groups, not to individuals. Studies can show that factor X is associated with outcome Y, but cannot guarantee that any specific person will have that outcome. As a result, you can "do everything right" and still have a serious health problem or "do everything wrong" and live to be 100.  Much of our current evidence comes from large epidemiological studies such as the [place] Aging Study, the Nurses' Health Study, the Adult Changes in Thought Study and the [place] Project. These studies explore pre-existing behaviors and use statistical methods to relate those behaviors to health outcomes. This type of study can show an "association" between a factor and an outcome but cannot "prove" cause and effect. This is why we describe evidence based on these studies with such language as "suggests," "may show," "might protect," and "is associated with."  The gold standard for showing cause and effect is a clinical trial in which participants are randomly assigned to a prevention or risk management strategy or a control group. Researchers follow the two groups over time to see if their outcomes differ significantly.  It is unlikely that some prevention or risk management strategies will ever be tested in randomized trials for ethical or practical reasons. One example is exercise. Definitively testing the impact of exercise on Alzheimer's risk would require a huge  trial enrolling thousands of people and following them for many years. The expense and logistics of such a trial would be prohibitive, and it would require some people to go without exercise, a known health benefit.    Mindfulness-Based Stress Reduction Mindfulness-based stress reduction (MBSR) is a program that helps people learn to practice mindfulness. Mindfulness is the practice of consciously paying attention to the present moment. MBSR focuses on developing self-awareness, which lets you respond to life stress without judgment or negative feelings. It can be learned and practiced through techniques such as education, breathing exercises, meditation, and yoga. MBSR includes several mindfulness techniques in one program. MBSR works best when you understand the treatment, are willing to try new things, and can commit to spending time practicing what you learn. MBSR training may include learning about: How your feelings, thoughts, and reactions affect your body. New ways to respond to things that cause negative thoughts to start (triggers). How to notice your thoughts and let go of them. Practicing awareness of everyday things that you normally do without thinking. The techniques and goals of different types of meditation. What are the benefits of MBSR? MBSR can have many benefits, which include helping you to: Develop self-awareness. This means knowing and understanding yourself. Learn skills and attitudes that help you to take part in your own health care. Learn new ways to care for yourself. Be more accepting about how things are, and let things go. Be less   judgmental and approach things with an open mind. Be patient with yourself and trust yourself more. MBSR has also been shown to: Reduce negative emotions, such as sadness, overwhelm, and worry. Improve memory and focus. Change how you sense and react to pain. Boost your body's ability to fight infections. Help you connect better with  other people. Improve your sense of well-being. How to practice mindfulness To do a basic awareness exercise: Find a comfortable place to sit. Pay attention to the present moment. Notice your thoughts, feelings, and surroundings just as they are. Avoid judging yourself, your feelings, or your surroundings. Make note of any judgment that comes up and let it go. Your mind may wander, and that is okay. Make note of when your thoughts drift, and return your attention to the present moment. To do basic mindfulness meditation: Find a comfortable place to sit. This may include a stable chair or a firm floor cushion. Sit upright with your back straight. Let your arms fall next to your sides, with your hands resting on your legs. If you are sitting in a chair, rest your feet flat on the floor. If you are sitting on a cushion, cross your legs in front of you. Keep your head in a neutral position with your chin dropped slightly. Relax your jaw and rest the tip of your tongue on the roof of your mouth. Drop your gaze to the floor or close your eyes. Breathe normally and pay attention to your breath. Feel the air moving in and out of your nose. Feel your belly expanding and relaxing with each breath. Your mind may wander, and that is okay. Make note of when your thoughts drift, and return your attention to your breath. Avoid judging yourself, your feelings, or your surroundings. Make note of any judgment or feelings that come up, let them go, and bring your attention back to your breath. When you are ready, lift your gaze or open your eyes. Pay attention to how your body feels after the meditation. Follow these instructions at home:  Find a local in-person or online MBSR program. Set aside some time regularly for mindfulness practice. Practice every day if you can. Even 10 minutes of practice is helpful. Find a mindfulness practice that works best for you. This may include one or more of the  following: Meditation. This involves focusing your mind on a certain thought or activity. Breathing awareness exercises. These help you to stay present by focusing on your breath. Body scan. For this practice, you lie down and pay attention to each part of your body from head to toe. You can identify tension and soreness and consciously relax parts of your body. Yoga. Yoga involves stretching and breathing, and it can improve your ability to move and be flexible. It can also help you to test your body's limits, which can help you release stress. Mindful eating. This way of eating involves focusing on the taste, texture, color, and smell of each bite of food. This slows down eating and helps you feel full sooner. For this reason, it can be an important part of a weight loss plan. Find a podcast or recording that provides guidance for breathing awareness, body scan, or meditation exercises. You can listen to these any time when you have a free moment to rest without distractions. Follow your treatment plan as told by your health care provider. This may include taking regular medicines and making changes to your diet or lifestyle as recommended. Where to find   more information You can find more information about MBSR from: Your health care provider. Community-based meditation centers or programs. Programs offered near you. Summary Mindfulness-based stress reduction (MBSR) is a program that teaches you how to consciously pay attention to the present moment. It is used to help you deal better with daily stress, feelings, and pain. MBSR focuses on developing self-awareness, which allows you to respond to life stress without judgment or negative feelings. MBSR programs may involve learning different mindfulness practices, such as breathing exercises, meditation, yoga, body scan, or mindful eating. Find a mindfulness practice that works best for you, and set aside time for it on a regular basis. This  information is not intended to replace advice given to you by your health care provider. Make sure you discuss any questions you have with your health care provider. Document Revised: 12/12/2020 Document Reviewed: 12/12/2020 [employer] Patient Education  2023 [employer]  

## 2022-09-14 NOTE — Progress Notes (Signed)
SLEEP MEDICINE CLINIC    Provider:  Melvyn Novas, MD  Primary Care Physician:  Claire Patience, Claire [address] [place] [address]     Referring Provider: Camie Patience, Claire [address] [place],  [place] [address]          Chief Complaint according to patient   Patient presents with:     New Patient (Initial Visit)           HISTORY OF PRESENT ILLNESS:  Claire Weaver is a 77 y.o. female patient who is seen upon referral on 09/14/2022 from Claire Weaver at [place], for a subjective concern of memory issues.  Chief concern according to patient :  Former patient of Claire Weaver, who has seen her for tremors-  .Patient states she is here today for memory concerns. Mother had vascular dementia, onset in mid 3s, saw Claire Weaver.    I have the pleasure of seeing Claire Weaver on 09/14/22 , a right-handed female, currently retired but planning to go back part-time. Her Mother had vascular dementia.    Social history: Divorced.  Patient is retired from [employer] and lives in a household with three other  adults. No pates . Family status is single , with 2 adult sons, 4 grandchildren. Last month she lost an adult  grandson " Claire Weaver"  at age 36 , he OD.  The patient currently plans to return to work.  Tobacco use;none.  ETOH use ;none,  Caffeine intake in form of Coffee( 10 ounces in AM  ) Soda( /) Tea ( rare) or energy drinks Exercise in form of walking.     Sleep habits are as follows: The patient's dinner time is between 6-6.30 PM. The patient goes to bed at 12 PM and continues to sleep for 6-7 hours, wakes for 2-3 bathroom breaks, the first time at 3 AM.   The preferred sleep position is left laterally, with the support of 2 pillows. She snores, loudly.  She has a dry mouth in AM, she may have apnea.   Dreams are reportedly frequent/vivid. She dreams horrific dreams since grandsons death. She is sitting up in bed- the bed gets all messy- she may be  enacting her dreams.  Tremors are no longer present: Here is Claire Weaver assessment; Claire Weaver is a 77 year old female, seen in request by her primary care physician Claire. Hyman Weaver, Claire Weaver for evaluation of tremor, initial evaluation was on July 24, 2019.   I have reviewed and summarized the referring note from the referring physician.  She has past medical history of hyperlipidemia, hypertension, diabetes, her mother suffered bilateral hands tremor, progressively worsening her elderly age, also suffered dementia. She is still physically active, continue to work at [employer], since 2018, she noticed gradual onset bilateral hands tremor, most noticeable when she is using her utensil, or putting on make-up, gradually getting worse over the past 3 years, sometimes with exertion, she felt mild bilateral lower extremity tremors, but she denied persistent sensory loss, weakness, denied gait abnormality, no bowel and bladder incontinence.  In addition, she complains of many years history of bilateral ear loud tinnitus, denies hearing loss, denies vertigo.  ,     Review of Systems: Out of a complete 14 system review, the patient complains of only the following symptoms, and all other reviewed systems are negative.:   Grief - loss of a grandson to drug overdose.    Afraid of  memory loss.  Fatigue, sleepiness , snoring, fragmented sleep, Nocturia 1-3 times    How likely are you to doze in the following situations: 0 = not likely, 1 = slight chance, 2 = moderate chance, 3 = high chance   Sitting and Reading? Watching Television? Sitting inactive in a public place (theater or meeting)? As a passenger in a car for an hour without a break? Lying down in the afternoon when circumstances permit? Sitting and talking to someone? Sitting quietly after lunch without alcohol? In a car, while stopped for a few minutes in traffic?   Total = 13/ 24 points   FSS endorsed at 40/ 63 points.   Social History    Socioeconomic History   Marital status: Divorced    Spouse name: Not on file   Number of children: 2 adult sons, 3 grandchildren living.    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   Right-handed.   Caffeine use: 1 cup coffee, 1 glass tea per day.   Social Determinants of Health   Financial Resource Strain: Not on file  Food Insecurity: Not on file  Transportation Needs: Not on file  Physical Activity: Not on file  Stress: Not on file  Social Connections: Not on file    Family History  Problem Relation Age of Onset   Dementia Mother    Diabetes Mother    Brain cancer Father     Past Medical History:  Diagnosis Date   Anxiety    Atypical nevus 01/23/2014   Right Outer Back-Moderate   Diabetes mellitus    Fatty liver    Hyperlipemia    Hypertension    IBS (irritable bowel syndrome)    Insomnia    Muscle twitching    Osteopenia    Sciatica    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     Continuous Blood Gluc Receiver (FREESTYLE LIBRE 14 DAY READER) DEVI Use 1 Device as directed     Continuous Blood Gluc Receiver  (FREESTYLE LIBRE 14 DAY READER) DEVI See admin instructions.     glipiZIDE (GLUCOTROL XL) 5 MG 24 hr tablet Take 1 tablet by mouth in the morning and at bedtime.     losartan (COZAAR) 100 MG tablet Take 100 mg by mouth daily.     metFORMIN (GLUCOPHAGE-XR) 500 MG 24 hr tablet Take 500 mg by mouth 2 (two) times daily.     pravastatin (PRAVACHOL) 10 MG tablet  Take 5 mg by mouth daily.      spironolactone (ALDACTONE) 25 MG tablet Take 25 mg by mouth daily.     celecoxib (CELEBREX) 200 MG capsule Take by mouth. (Patient not taking: Reported on 09/14/2022)     Cholecalciferol 25 MCG (1000 UT) tablet Take by mouth. (Patient not taking: Reported on 09/14/2022)     cyanocobalamin 1000 MCG tablet Take by mouth. (Patient not taking: Reported on 09/14/2022)     Dulaglutide (TRULICITY) 0.75 MG/0.5ML SOPN Inject into the skin. (Patient not taking: Reported on 09/14/2022)     JARDIANCE 25 MG TABS tablet Take 25 mg by mouth daily.     metFORMIN (GLUCOPHAGE-XR) 500 MG 24 hr tablet 1 tablet (Patient not taking: Reported on 09/14/2022)     No current facility-administered medications on file prior to visit.    Allergies  Allergen Reactions   Shellfish-Derived Products Hives   Amoxapine And Related Hives   Atorvastatin Other (See Comments)    Leg cramps   Bactrim Hives   Iodinated Contrast Media Hives   Sulfamethoxazole-Trimethoprim Hives     DIAGNOSTIC DATA (LABS, IMAGING, TESTING) - I reviewed patient records, labs, notes, testing and imaging myself where available.  Lab Results  Component Value Date   WBC 4.4 09/25/2014   HGB 12.4 09/25/2014   HCT 36.9 09/25/2014   MCV 89.5 09/25/2014   PLT 183 09/25/2014      Component Value Date/Time   NA 139 09/25/2014 0928   K 4.0 09/25/2014 0928   CL 105 09/25/2014 0928   CO2 26 09/25/2014 0928   GLUCOSE 111 (H) 09/25/2014 0928   BUN 18 09/25/2014 0928   CREATININE 0.67 09/25/2014 0928   CALCIUM 8.8 (L) 09/25/2014 0928   PROT 6.5 07/08/2010 1759    ALBUMIN 4.0 07/08/2010 1759   AST 26 07/08/2010 1759   ALT 19 07/08/2010 1759   ALKPHOS 78 07/08/2010 1759   BILITOT 0.5 07/08/2010 1759   GFRNONAA >60 09/25/2014 0928   GFRAA >60 09/25/2014 0928   Lab Results  Component Value Date   CHOL  07/05/2010    176        ATP III CLASSIFICATION:  <200     mg/dL   Desirable  161-096  mg/dL   Borderline High  >=045    mg/dL   High          HDL 33 (L) 07/05/2010   LDLCALC  07/05/2010    76        Total Cholesterol/HDL:CHD Risk Coronary Heart Disease Risk Table                     Men   Women  1/2 Average Risk   3.4   3.3  Average Risk       5.0   4.4  2 X Average Risk   9.6   7.1  3 X Average Risk  23.4   11.0        Use the calculated Patient Ratio above and the CHD Risk Table to determine the patient's CHD Risk.        ATP III CLASSIFICATION (LDL):  <100     mg/dL   Optimal  409-811  mg/dL   Near or Above                    Optimal  130-159  mg/dL   Borderline  914-782  mg/dL   High  >956     mg/dL  Very High   TRIG 333 (H) 07/05/2010   CHOLHDL 5.3 07/05/2010   No results found for: "HGBA1C" No results found for: "VITAMINB12" Lab Results  Component Value Date   TSH 1.450 07/04/2010    PHYSICAL EXAM:  Today's Vitals   09/14/22 1400  BP: 139/68  Pulse: 60  Weight: 180 lb (81.6 kg)  Height: 5\' 5"  (1.651 m)   Body mass index is 29.95 kg/m.   Wt Readings from Last 3 Encounters:  09/14/22 180 lb (81.6 kg)  07/24/19 191 lb 8 oz (86.9 kg)  09/25/14 181 lb (82.1 kg)     Ht Readings from Last 3 Encounters:  09/14/22 5\' 5"  (1.651 m)  07/24/19 5\' 5"  (1.651 m)  09/25/14 5\' 5"  (1.651 m)      General: The patient is awake, alert and appears not in acute distress. The patient is well groomed. Head: Normocephalic, atraumatic. Neck is supple.  Mallampati 3,  neck circumference:14 inches . Nasal airflow  patent.  Retrognathia is  seen.  Dental status: biological  Cardiovascular:  Regular rate and cardiac rhythm by  pulse,  without distended neck veins. Respiratory: Lungs are clear to auscultation.  Skin:  Without evidence of ankle edema, or rash. Trunk: The patient's posture is erect.   NEUROLOGIC EXAM: The patient is awake and alert, oriented to place and time.   Memory subjective described as impaired, delayed recall mentioned, attention and multitasking is preserved she feels.     09/14/2022    2:04 PM  Montreal Cognitive Assessment   Visuospatial/ Executive (0/5) 4  Naming (0/3) 3  Attention: Read list of digits (0/2) 2  Attention: Read list of letters (0/1) 1  Attention: Serial 7 subtraction starting at 100 (0/3) 3  Language: Repeat phrase (0/2) 2  Language : Fluency (0/1) 1  Abstraction (0/2) 2  Delayed Recall (0/5) 5  Orientation (0/6) 6  Total 29    Attention span & concentration ability appears normal.  Speech is fluent,  without dysarthria, mild dysphonia , eloquent. .  Mood and affect are appropriate.   Cranial nerves: no loss of smell or taste reported  Pupils are equally small 2 mm, round and there is little  reactivity to light.  Funduscopic exam deferred. .cataract in one eye. Extraocular movements in vertical and horizontal planes were intact and without nystagmus. No Diplopia. Visual fields by finger perimetry are intact. Hearing was reduced  to soft voice and finger rubbing in left ear, some milder hearing loss in te right. Tinnitus in the left. Continuous high pitched sound. This is non-pulsatile .    Facial sensation intact to fine touch.  Facial motor strength is symmetric and tongue and uvula move midline.  Neck ROM : rotation, tilt and flexion extension were normal for age and shoulder shrug was symmetrical.    Motor exam:  Symmetric bulk, tone and ROM.   Normal tone without cog- wheeling, symmetric grip strength .   Sensory:  Fine touch and vibration were intact.  Proprioception tested in the upper extremities was normal.   Coordination: deferred   Gait and  station: Patient could rise unassisted from a seated position, walked without assistive device.  Stance is of normal width/ base .  Toe and heel walk were deferred.  Deep tendon reflexes: in the  upper and lower extremities are symmetric and intact.  Babinski response was deferred     ASSESSMENT AND PLAN 77 y.o. year old female  here with:    1)  concern of memory loss- MOCA was 29/ 30 and she corrected her drawing of a three dimensional box, her only mistake.   2) In day- to -day -life she has experienced delayed finding of words, of names. She has not gotten lost, has no trouble to keep appointments and she is a good multi-tasker.  She is good at calculating prices, tips etc.  3) there is  much less concern about heredity of dementia in children of vascular dementia patients. She explicitly described her mothers stepwise decline.   4) she is at some risk of having OSA, just by physical exam. HST  will be ordered.  5) I will repeat memory MOCA testing in 12 months if patient decides to follow up.    I plan to follow up either personally or through our NP within 3-5 months.   I would like to thank Claire Patience, Claire and Claire Patience, Claire [address] [place],  [place] [address] for allowing me to meet with and to take care of this pleasant patient.   CC: I will share my notes with PCP.  After spending a total time of  45  minutes face to face and additional time for physical and neurologic examination, review of laboratory studies,  personal review of imaging studies, reports and results of other testing and review of referral information / records as far as provided in visit,   Electronically signed by: Melvyn Novas, MD 09/14/2022 2:31 PM  [facility] Board certified by The [employer] of Sleep Medicine and Diplomate of the [employer] of Sleep Medicine. Board certified In Neurology through the ABPN, Fellow of the  [employer] of Neu[employer]. Medical Director of [facility].

## 2022-09-21 DIAGNOSIS — H52213 Irregular astigmatism, bilateral: Secondary | ICD-10-CM | POA: Diagnosis not present

## 2022-09-21 DIAGNOSIS — H25813 Combined forms of age-related cataract, bilateral: Secondary | ICD-10-CM | POA: Diagnosis not present

## 2022-09-21 DIAGNOSIS — H524 Presbyopia: Secondary | ICD-10-CM | POA: Diagnosis not present

## 2022-09-21 DIAGNOSIS — H25812 Combined forms of age-related cataract, left eye: Secondary | ICD-10-CM | POA: Diagnosis not present

## 2022-09-21 DIAGNOSIS — H40033 Anatomical narrow angle, bilateral: Secondary | ICD-10-CM | POA: Diagnosis not present

## 2022-09-21 DIAGNOSIS — H35362 Drusen (degenerative) of macula, left eye: Secondary | ICD-10-CM | POA: Diagnosis not present

## 2022-09-21 DIAGNOSIS — E119 Type 2 diabetes mellitus without complications: Secondary | ICD-10-CM | POA: Diagnosis not present

## 2022-10-06 DIAGNOSIS — R400 Somnolence: Secondary | ICD-10-CM | POA: Diagnosis not present

## 2022-10-06 DIAGNOSIS — E1136 Type 2 diabetes mellitus with diabetic cataract: Secondary | ICD-10-CM | POA: Diagnosis not present

## 2022-10-06 DIAGNOSIS — E785 Hyperlipidemia, unspecified: Secondary | ICD-10-CM | POA: Diagnosis not present

## 2022-10-06 DIAGNOSIS — R6889 Other general symptoms and signs: Secondary | ICD-10-CM | POA: Diagnosis not present

## 2022-10-06 DIAGNOSIS — E1140 Type 2 diabetes mellitus with diabetic neuropathy, unspecified: Secondary | ICD-10-CM | POA: Diagnosis not present

## 2022-10-06 DIAGNOSIS — F439 Reaction to severe stress, unspecified: Secondary | ICD-10-CM | POA: Diagnosis not present

## 2022-10-06 DIAGNOSIS — E1151 Type 2 diabetes mellitus with diabetic peripheral angiopathy without gangrene: Secondary | ICD-10-CM | POA: Diagnosis not present

## 2022-10-14 ENCOUNTER — Telehealth: Payer: Self-pay | Admitting: Neurology

## 2022-10-14 NOTE — Telephone Encounter (Signed)
HTA pending faxed notes 

## 2022-10-19 NOTE — Telephone Encounter (Signed)
HST- HTA Berkley Harvey: [id number] (Exp. 10/14/22 to 01/12/23)

## 2022-10-20 DIAGNOSIS — H25812 Combined forms of age-related cataract, left eye: Secondary | ICD-10-CM | POA: Diagnosis not present

## 2022-11-03 DIAGNOSIS — L570 Actinic keratosis: Secondary | ICD-10-CM | POA: Diagnosis not present

## 2022-11-03 DIAGNOSIS — M5416 Radiculopathy, lumbar region: Secondary | ICD-10-CM | POA: Diagnosis not present

## 2022-11-03 DIAGNOSIS — D1801 Hemangioma of skin and subcutaneous tissue: Secondary | ICD-10-CM | POA: Diagnosis not present

## 2022-11-03 DIAGNOSIS — L821 Other seborrheic keratosis: Secondary | ICD-10-CM | POA: Diagnosis not present

## 2022-11-10 ENCOUNTER — Ambulatory Visit: Payer: HMO | Admitting: Neurology

## 2022-11-10 DIAGNOSIS — G4733 Obstructive sleep apnea (adult) (pediatric): Secondary | ICD-10-CM

## 2022-11-10 DIAGNOSIS — G4739 Other sleep apnea: Secondary | ICD-10-CM

## 2022-11-10 DIAGNOSIS — G250 Essential tremor: Secondary | ICD-10-CM

## 2022-11-10 DIAGNOSIS — E669 Obesity, unspecified: Secondary | ICD-10-CM

## 2022-11-10 DIAGNOSIS — R400 Somnolence: Secondary | ICD-10-CM

## 2022-11-10 DIAGNOSIS — G4731 Primary central sleep apnea: Secondary | ICD-10-CM

## 2022-11-10 DIAGNOSIS — E66811 Obesity, class 1: Secondary | ICD-10-CM

## 2022-11-10 DIAGNOSIS — R4189 Other symptoms and signs involving cognitive functions and awareness: Secondary | ICD-10-CM

## 2022-11-11 NOTE — Progress Notes (Signed)
[facility]   HOME SLEEP TEST REPORT ( by Watch PAT)   STUDY DATE:  11-11-2022 Claire Weaver 77 year old female October 31, 1945   ORDERING CLINICIAN: Melvyn Novas, MD  REFERRING CLINICIAN: NP Hayes/   CLINICAL INFORMATION/HISTORY: 09-14-2022:Claire Weaver is a 77 y.o. female patient who is seen upon referral on 09/14/2022 from FNP Barrett Shell at [place], for a subjective concern of memory issues.  Chief concern according to patient :  Former patient of Dr Terrace Arabia, who has seen her for tremors-  .Patient states she is here today for memory concerns. Mother had vascular dementia, onset in mid 10s, she had seen Dr Anne Hahn.   Epworth sleepiness score: 13/24. FSS  40/63 points.    BMI:30 kg/m   Neck Circumference: 14"   FINDINGS:   Sleep Summary:   Total Recording Time (hours, min):   9 hours 35 minutes  Total Sleep Time (hours, min):    8 hours 28 minutes             Percent REM (%):      12%                                  Respiratory Indices:   Calculated pAHI (per hour):   Scored by CMS criteria 36.5/h,                          REM pAHI:        14.3/h,                                         NREM pAHI:   39.5/h                           Positional AHI:   The patient slept similar amount of time in supine and nonsupine position.  Supine AHI by 4% scoring was 40.6/h and nonsupine 28.1/h.  I would like to state that sleep on the right side was associated with an AHI of 52 and sleep on the left with an AHI of 6/h.  You can also see this in the data sheet below.  Snoring reached a mean volume of 42 dB and was present for about 40% of the total sleep time.                                                Oxygen Saturation Statistics:       O2 Saturation Range (%): Between the nadir at 84 and a maximum of 99 with a mean saturation of 95%                                      O2 Saturation (minutes) <89%: 4 minutes         Pulse Rate Statistics:   Pulse  Mean (bpm):    65 bpm             Pulse Range:   Between 47 and a maximum  of 94 bpm.  The cardiac rhythm is not reflected in this home sleep test result.              IMPRESSION:  This HST confirms the presence of severe sleep apnea following either AASM or CMS criteria.  There is an interesting normal REM sleep dominance of this apnea .  No clear positional dominance was seen either.   RECOMMENDATION: I suspect that this severe apnea may have more central components than the home sleep test is able to detect.  The results displayed as a little over 20% central apnea the rest obstructive or presumed to be obstructive in nature.  We were not able to see supine REM sleep.  Based on these results I think that the patient needs to start therapy as early as possible.  I would have much preferred to get her into the sleep lab for an attended CPAP titration as I feel that central apnea component will overtake the obstructive during therapy.  This however is associated with a waiting period of over 8 weeks.  What we will do instead is starting her on an auto titration CPAP machine and if central apneas are emerging I will then invite her into the sleep lab for a attended positive airway pressure titration.    INTERPRETING PHYSICIAN:   Melvyn Novas, MD    [facility].

## 2022-11-13 ENCOUNTER — Other Ambulatory Visit: Payer: Self-pay | Admitting: Surgery

## 2022-11-13 DIAGNOSIS — R222 Localized swelling, mass and lump, trunk: Secondary | ICD-10-CM | POA: Diagnosis not present

## 2022-11-16 DIAGNOSIS — G4731 Primary central sleep apnea: Secondary | ICD-10-CM | POA: Insufficient documentation

## 2022-11-16 DIAGNOSIS — E669 Obesity, unspecified: Secondary | ICD-10-CM | POA: Insufficient documentation

## 2022-11-16 NOTE — Procedures (Signed)
[facility]   HOME SLEEP TEST REPORT ( by Watch PAT)   STUDY DATE:  11-11-2022 Claire Weaver 77 year old female 05-18-1946   ORDERING CLINICIAN: Melvyn Novas, MD  REFERRING CLINICIAN: NP Hayes/   CLINICAL INFORMATION/HISTORY: 09-14-2022:Claire Weaver is a 77 y.o. female patient who is seen upon referral on 09/14/2022 from FNP Barrett Shell at [place], for a subjective concern of memory issues.  Chief concern according to patient :  Former patient of Dr Terrace Arabia, who has seen her for tremors-  .Patient states she is here today for memory concerns. Mother had vascular dementia, onset in mid 62s, she had seen Dr Anne Hahn.   Epworth sleepiness score: 13/24. FSS  40/63 points.    BMI:30 kg/m   Neck Circumference: 14"   FINDINGS:   Sleep Summary:   Total Recording Time (hours, min):   9 hours 35 minutes  Total Sleep Time (hours, min):    8 hours 28 minutes             Percent REM (%):      12%                                  Respiratory Indices:   Calculated pAHI (per hour):   Scored by CMS criteria 36.5/h,                          REM pAHI:        14.3/h,                                         NREM pAHI:   39.5/h                           Positional AHI:   The patient slept similar amount of time in supine and nonsupine position.  Supine AHI by 4% scoring was 40.6/h and nonsupine 28.1/h.  I would like to state that sleep on the right side was associated with an AHI of 52 and sleep on the left with an AHI of 6/h.  You can also see this in the data sheet below.  Snoring reached a mean volume of 42 dB and was present for about 40% of the total sleep time.                                                Oxygen Saturation Statistics:       O2 Saturation Range (%): Between the nadir at 84 and a maximum of 99 with a mean saturation of 95%                                      O2 Saturation (minutes) <89%: 4 minutes         Pulse Rate Statistics:   Pulse Mean  (bpm):    65 bpm             Pulse Range:   Between 47 and a maximum of 94 bpm.  The cardiac rhythm is not reflected in this home sleep test result.              IMPRESSION:  This HST confirms the presence of severe  COMPLEX sleep apnea following either AASM or CMS criteria.  There is an interesting non-REM sleep dominance of this apnea seen- unusual for obstructive apneas and usually a sign of central apneas to be present.  No clear positional dominance was seen either.   RECOMMENDATION: I suspect that this severe complex apnea may have more central components than the home sleep test is able to detect.   The result's displayed show a little over 20% central apnea -the rest presumed to be obstructive (OSA) .  We were not able to see supine REM sleep.  Based on these results I think that the patient needs to start therapy as early as possible.  I would have much preferred to get her into the sleep lab for an attended CPAP titration as I feel that central apnea component will overtake the obstructive during therapy.  This however is associated with a waiting period of over 8 weeks.  What we will do instead is starting her on an auto titration CPAP machine and if central apneas are emerging I will then invite her into the sleep lab for a attended positive airway pressure titration.    INTERPRETING PHYSICIAN:   Melvyn Novas, MD    [facility] at [facility].

## 2022-11-18 ENCOUNTER — Telehealth: Payer: Self-pay | Admitting: Neurology

## 2022-11-18 NOTE — Telephone Encounter (Signed)
-----   Message from Melvyn Novas, MD sent at 11/16/2022 12:37 PM EDT ----- We will treat ASAP with auto CPAP, given the severity of this complex sleep apnea. I have concerns that the HST did not pick up on the complex/ central apnea part.   With auto CPAP to start we can see at least if central apneas are emerging under treatment  or not, I prefer a 30-60 day RV and if these central apneas are present, will order in lab CPAP/ BiPAP to follow.  I placed an order for this test already,  the waiting time is long- better to be on the list. Cc Dr Terrace Arabia

## 2022-11-18 NOTE — Telephone Encounter (Signed)
Called patient to discuss sleep study results. No answer at this time. LVM for the patient to call back.  Will send a mychart message as well. 

## 2022-11-23 ENCOUNTER — Ambulatory Visit
Admission: RE | Admit: 2022-11-23 | Discharge: 2022-11-23 | Disposition: A | Discharge status: Home / Self Care | Payer: HMO | Source: Ambulatory Visit | Attending: Surgery | Admitting: Surgery

## 2022-11-23 DIAGNOSIS — R222 Localized swelling, mass and lump, trunk: Secondary | ICD-10-CM | POA: Diagnosis not present

## 2022-11-23 MED ORDER — GADOPICLENOL 0.5 MMOL/ML IV SOLN
9.0000 mL | Freq: Once | INTRAVENOUS | Status: AC | PRN
  Administered 2022-11-23: 9 mL via INTRAVENOUS

## 2022-12-01 NOTE — Progress Notes (Signed)
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  There are no masses seen that need to be removed.

## 2022-12-03 DIAGNOSIS — R1031 Right lower quadrant pain: Secondary | ICD-10-CM | POA: Diagnosis not present

## 2022-12-03 DIAGNOSIS — E1165 Type 2 diabetes mellitus with hyperglycemia: Secondary | ICD-10-CM | POA: Diagnosis not present

## 2022-12-07 DIAGNOSIS — H25811 Combined forms of age-related cataract, right eye: Secondary | ICD-10-CM | POA: Diagnosis not present

## 2022-12-09 NOTE — Telephone Encounter (Signed)
Received communication from [facility] on 12/09/22, stating pt has decline to move forward with set up at this time due to pt's out of pocket cost. Pt is aware they can call [facility] if decides to move forward.

## 2022-12-22 DIAGNOSIS — H268 Other specified cataract: Secondary | ICD-10-CM | POA: Diagnosis not present

## 2022-12-22 DIAGNOSIS — H25811 Combined forms of age-related cataract, right eye: Secondary | ICD-10-CM | POA: Diagnosis not present

## 2023-01-03 DIAGNOSIS — M5136 Other intervertebral disc degeneration, lumbar region: Secondary | ICD-10-CM | POA: Diagnosis not present

## 2023-01-04 DIAGNOSIS — F419 Anxiety disorder, unspecified: Secondary | ICD-10-CM | POA: Diagnosis not present

## 2023-01-04 DIAGNOSIS — E1165 Type 2 diabetes mellitus with hyperglycemia: Secondary | ICD-10-CM | POA: Diagnosis not present

## 2023-01-05 NOTE — Telephone Encounter (Signed)
Pt called wanting to discuss another route to take that is not the Cpap machine. Please advise.

## 2023-01-05 NOTE — Telephone Encounter (Signed)
Lvm to callback and to go into further detail about their reasoning for no longer wanting the cpap.

## 2023-01-05 NOTE — Telephone Encounter (Signed)
Severe sleep apnea and NREM sleep dominant apnea. CMS criteria based scoring AHI was 36.5/h.  Mild hypoxia, not a severe form, the nadir was above 80%    BMI is 30.  The best treatment of sleep apnea and easiest to follow therapeutic efficiency is by PAP- CPAP or BiPAP. All other treatment such as by dental device to implanted tongue nerve stimulator do not provide therapeutic data and result in repeated sleep apnea testing.  Untreated sleep apnea has a detrimental effect on memory, heart health , stroke risk and DM control.

## 2023-01-11 NOTE — Telephone Encounter (Signed)
Pt returned call. Please call back when available. 

## 2023-01-12 NOTE — Telephone Encounter (Signed)
Pt returned call and stated that she doesn't think she can stand sleeping in the mask and that she needs something less cost prohibitive.   Routing to provider to advise

## 2023-01-15 NOTE — Telephone Encounter (Signed)
  She has severe sleep apnea according to HST results. 20% are presumed central apneas. That's not a favorable mix for alternative treatments: There seem to be several barriers to compliance.   1) CPAP costs are her concern?  That would be addressed with her insurance .  2) She mentioned she cannot tolerate the mask? ( Or is it the pressure setting?)  That's to be addressed with DME, she will not be charged for any mask refitting in the first 30 days of therapy. The interest of the DME is to make sure she can use PAP therapy.  Please tell her we will obtain a download of data for the weeks she has been on therapy now.  Compliance documentation and to make sure no central apneas are arising under therapy.  FYI :RN Marlana Latus

## 2023-01-19 NOTE — Telephone Encounter (Signed)
Called the patient and advised that given her concerns and question that she is having in regards to moving forward and starting treatment it sounds that it would be beneficial to come in and discuss further with Dr Vickey Huger. I have an opening for tomorrow 01/20/2023 at 1 pm check in of 12:30 offered. Will await call back **If call back, please offer slot on hold tomorrow. Alternatively we can look at a different day

## 2023-02-13 DIAGNOSIS — M5416 Radiculopathy, lumbar region: Secondary | ICD-10-CM | POA: Diagnosis not present

## 2023-02-17 DIAGNOSIS — M13841 Other specified arthritis, right hand: Secondary | ICD-10-CM | POA: Diagnosis not present

## 2023-02-17 DIAGNOSIS — K529 Noninfective gastroenteritis and colitis, unspecified: Secondary | ICD-10-CM | POA: Diagnosis not present

## 2023-02-17 DIAGNOSIS — E1165 Type 2 diabetes mellitus with hyperglycemia: Secondary | ICD-10-CM | POA: Diagnosis not present

## 2023-02-17 DIAGNOSIS — M65841 Other synovitis and tenosynovitis, right hand: Secondary | ICD-10-CM | POA: Diagnosis not present

## 2023-02-25 DIAGNOSIS — R197 Diarrhea, unspecified: Secondary | ICD-10-CM | POA: Diagnosis not present

## 2023-02-25 DIAGNOSIS — E1165 Type 2 diabetes mellitus with hyperglycemia: Secondary | ICD-10-CM | POA: Diagnosis not present

## 2023-03-05 DIAGNOSIS — H04213 Epiphora due to excess lacrimation, bilateral lacrimal glands: Secondary | ICD-10-CM | POA: Diagnosis not present

## 2023-03-05 DIAGNOSIS — H1045 Other chronic allergic conjunctivitis: Secondary | ICD-10-CM | POA: Diagnosis not present

## 2023-03-11 DIAGNOSIS — L720 Epidermal cyst: Secondary | ICD-10-CM | POA: Diagnosis not present

## 2023-03-11 DIAGNOSIS — L820 Inflamed seborrheic keratosis: Secondary | ICD-10-CM | POA: Diagnosis not present

## 2023-03-11 DIAGNOSIS — L738 Other specified follicular disorders: Secondary | ICD-10-CM | POA: Diagnosis not present

## 2023-03-11 DIAGNOSIS — B078 Other viral warts: Secondary | ICD-10-CM | POA: Diagnosis not present

## 2023-03-28 DIAGNOSIS — M5459 Other low back pain: Secondary | ICD-10-CM | POA: Diagnosis not present

## 2023-03-28 DIAGNOSIS — M48062 Spinal stenosis, lumbar region with neurogenic claudication: Secondary | ICD-10-CM | POA: Diagnosis not present

## 2023-03-28 DIAGNOSIS — M5416 Radiculopathy, lumbar region: Secondary | ICD-10-CM | POA: Diagnosis not present

## 2023-04-07 DIAGNOSIS — Z5971 Insufficient health insurance coverage: Secondary | ICD-10-CM | POA: Diagnosis not present

## 2023-04-07 DIAGNOSIS — E785 Hyperlipidemia, unspecified: Secondary | ICD-10-CM | POA: Diagnosis not present

## 2023-04-07 DIAGNOSIS — E1165 Type 2 diabetes mellitus with hyperglycemia: Secondary | ICD-10-CM | POA: Diagnosis not present

## 2023-04-07 DIAGNOSIS — I10 Essential (primary) hypertension: Secondary | ICD-10-CM | POA: Diagnosis not present

## 2023-04-07 DIAGNOSIS — N289 Disorder of kidney and ureter, unspecified: Secondary | ICD-10-CM | POA: Diagnosis not present

## 2023-04-19 DIAGNOSIS — M5459 Other low back pain: Secondary | ICD-10-CM | POA: Diagnosis not present

## 2023-04-26 DIAGNOSIS — M48062 Spinal stenosis, lumbar region with neurogenic claudication: Secondary | ICD-10-CM | POA: Diagnosis not present

## 2023-04-28 DIAGNOSIS — H35362 Drusen (degenerative) of macula, left eye: Secondary | ICD-10-CM | POA: Diagnosis not present

## 2023-04-28 DIAGNOSIS — H35033 Hypertensive retinopathy, bilateral: Secondary | ICD-10-CM | POA: Diagnosis not present

## 2023-04-28 DIAGNOSIS — H43812 Vitreous degeneration, left eye: Secondary | ICD-10-CM | POA: Diagnosis not present

## 2023-04-30 DIAGNOSIS — M48061 Spinal stenosis, lumbar region without neurogenic claudication: Secondary | ICD-10-CM | POA: Diagnosis not present

## 2023-04-30 DIAGNOSIS — M5126 Other intervertebral disc displacement, lumbar region: Secondary | ICD-10-CM | POA: Diagnosis not present

## 2023-05-31 DIAGNOSIS — H43812 Vitreous degeneration, left eye: Secondary | ICD-10-CM | POA: Diagnosis not present

## 2023-06-22 DIAGNOSIS — M5459 Other low back pain: Secondary | ICD-10-CM | POA: Diagnosis not present

## 2023-06-24 DIAGNOSIS — L570 Actinic keratosis: Secondary | ICD-10-CM | POA: Diagnosis not present

## 2023-06-24 DIAGNOSIS — L820 Inflamed seborrheic keratosis: Secondary | ICD-10-CM | POA: Diagnosis not present

## 2023-06-24 DIAGNOSIS — X32XXXA Exposure to sunlight, initial encounter: Secondary | ICD-10-CM | POA: Diagnosis not present

## 2023-07-05 DIAGNOSIS — F419 Anxiety disorder, unspecified: Secondary | ICD-10-CM | POA: Diagnosis not present

## 2023-07-05 DIAGNOSIS — G250 Essential tremor: Secondary | ICD-10-CM | POA: Diagnosis not present

## 2023-07-05 DIAGNOSIS — Z0000 Encounter for general adult medical examination without abnormal findings: Secondary | ICD-10-CM | POA: Diagnosis not present

## 2023-07-05 DIAGNOSIS — E1165 Type 2 diabetes mellitus with hyperglycemia: Secondary | ICD-10-CM | POA: Diagnosis not present

## 2023-07-05 DIAGNOSIS — E559 Vitamin D deficiency, unspecified: Secondary | ICD-10-CM | POA: Diagnosis not present

## 2023-07-05 DIAGNOSIS — G4733 Obstructive sleep apnea (adult) (pediatric): Secondary | ICD-10-CM | POA: Diagnosis not present

## 2023-07-05 DIAGNOSIS — F331 Major depressive disorder, recurrent, moderate: Secondary | ICD-10-CM | POA: Diagnosis not present

## 2023-07-05 DIAGNOSIS — I10 Essential (primary) hypertension: Secondary | ICD-10-CM | POA: Diagnosis not present

## 2023-07-05 DIAGNOSIS — E785 Hyperlipidemia, unspecified: Secondary | ICD-10-CM | POA: Diagnosis not present

## 2023-07-05 DIAGNOSIS — Z1331 Encounter for screening for depression: Secondary | ICD-10-CM | POA: Diagnosis not present

## 2023-07-28 DIAGNOSIS — H35362 Drusen (degenerative) of macula, left eye: Secondary | ICD-10-CM | POA: Diagnosis not present

## 2023-07-28 DIAGNOSIS — E119 Type 2 diabetes mellitus without complications: Secondary | ICD-10-CM | POA: Diagnosis not present

## 2023-07-28 DIAGNOSIS — H35033 Hypertensive retinopathy, bilateral: Secondary | ICD-10-CM | POA: Diagnosis not present

## 2023-07-28 DIAGNOSIS — H04223 Epiphora due to insufficient drainage, bilateral lacrimal glands: Secondary | ICD-10-CM | POA: Diagnosis not present

## 2023-07-28 DIAGNOSIS — H04123 Dry eye syndrome of bilateral lacrimal glands: Secondary | ICD-10-CM | POA: Diagnosis not present

## 2023-07-28 DIAGNOSIS — H43812 Vitreous degeneration, left eye: Secondary | ICD-10-CM | POA: Diagnosis not present

## 2023-08-02 DIAGNOSIS — Z1231 Encounter for screening mammogram for malignant neoplasm of breast: Secondary | ICD-10-CM | POA: Diagnosis not present

## 2023-08-03 DIAGNOSIS — R222 Localized swelling, mass and lump, trunk: Secondary | ICD-10-CM | POA: Diagnosis not present

## 2023-08-04 DIAGNOSIS — K580 Irritable bowel syndrome with diarrhea: Secondary | ICD-10-CM | POA: Diagnosis not present

## 2023-08-04 DIAGNOSIS — I10 Essential (primary) hypertension: Secondary | ICD-10-CM | POA: Diagnosis not present

## 2023-10-04 DIAGNOSIS — F331 Major depressive disorder, recurrent, moderate: Secondary | ICD-10-CM | POA: Diagnosis not present

## 2023-10-04 DIAGNOSIS — E1165 Type 2 diabetes mellitus with hyperglycemia: Secondary | ICD-10-CM | POA: Diagnosis not present

## 2023-10-04 DIAGNOSIS — R195 Other fecal abnormalities: Secondary | ICD-10-CM | POA: Diagnosis not present

## 2023-10-04 DIAGNOSIS — I10 Essential (primary) hypertension: Secondary | ICD-10-CM | POA: Diagnosis not present

## 2023-10-04 DIAGNOSIS — E785 Hyperlipidemia, unspecified: Secondary | ICD-10-CM | POA: Diagnosis not present

## 2023-10-04 DIAGNOSIS — R159 Full incontinence of feces: Secondary | ICD-10-CM | POA: Diagnosis not present

## 2023-10-29 DIAGNOSIS — R159 Full incontinence of feces: Secondary | ICD-10-CM | POA: Diagnosis not present

## 2023-10-29 DIAGNOSIS — Z9049 Acquired absence of other specified parts of digestive tract: Secondary | ICD-10-CM | POA: Diagnosis not present

## 2023-10-29 DIAGNOSIS — R1032 Left lower quadrant pain: Secondary | ICD-10-CM | POA: Diagnosis not present

## 2023-10-29 DIAGNOSIS — R198 Other specified symptoms and signs involving the digestive system and abdomen: Secondary | ICD-10-CM | POA: Diagnosis not present

## 2023-11-05 DIAGNOSIS — E1165 Type 2 diabetes mellitus with hyperglycemia: Secondary | ICD-10-CM | POA: Diagnosis not present

## 2023-11-05 DIAGNOSIS — I739 Peripheral vascular disease, unspecified: Secondary | ICD-10-CM | POA: Diagnosis not present

## 2023-11-05 DIAGNOSIS — H35033 Hypertensive retinopathy, bilateral: Secondary | ICD-10-CM | POA: Diagnosis not present

## 2023-11-05 DIAGNOSIS — E1140 Type 2 diabetes mellitus with diabetic neuropathy, unspecified: Secondary | ICD-10-CM | POA: Diagnosis not present

## 2023-11-06 DIAGNOSIS — E1140 Type 2 diabetes mellitus with diabetic neuropathy, unspecified: Secondary | ICD-10-CM | POA: Diagnosis not present

## 2023-11-06 DIAGNOSIS — H35033 Hypertensive retinopathy, bilateral: Secondary | ICD-10-CM | POA: Diagnosis not present

## 2023-11-06 DIAGNOSIS — I739 Peripheral vascular disease, unspecified: Secondary | ICD-10-CM | POA: Diagnosis not present

## 2023-11-06 DIAGNOSIS — E1165 Type 2 diabetes mellitus with hyperglycemia: Secondary | ICD-10-CM | POA: Diagnosis not present

## 2023-12-01 DIAGNOSIS — H0288A Meibomian gland dysfunction right eye, upper and lower eyelids: Secondary | ICD-10-CM | POA: Diagnosis not present

## 2023-12-01 DIAGNOSIS — H00021 Hordeolum internum right upper eyelid: Secondary | ICD-10-CM | POA: Diagnosis not present

## 2023-12-01 DIAGNOSIS — H0288B Meibomian gland dysfunction left eye, upper and lower eyelids: Secondary | ICD-10-CM | POA: Diagnosis not present

## 2023-12-04 DIAGNOSIS — I739 Peripheral vascular disease, unspecified: Secondary | ICD-10-CM | POA: Diagnosis not present

## 2023-12-04 DIAGNOSIS — E1165 Type 2 diabetes mellitus with hyperglycemia: Secondary | ICD-10-CM | POA: Diagnosis not present

## 2023-12-04 DIAGNOSIS — E1140 Type 2 diabetes mellitus with diabetic neuropathy, unspecified: Secondary | ICD-10-CM | POA: Diagnosis not present

## 2023-12-04 DIAGNOSIS — H35033 Hypertensive retinopathy, bilateral: Secondary | ICD-10-CM | POA: Diagnosis not present

## 2023-12-06 DIAGNOSIS — R1032 Left lower quadrant pain: Secondary | ICD-10-CM | POA: Diagnosis not present

## 2023-12-16 DIAGNOSIS — I739 Peripheral vascular disease, unspecified: Secondary | ICD-10-CM | POA: Diagnosis not present

## 2023-12-16 DIAGNOSIS — H35033 Hypertensive retinopathy, bilateral: Secondary | ICD-10-CM | POA: Diagnosis not present

## 2023-12-16 DIAGNOSIS — E1140 Type 2 diabetes mellitus with diabetic neuropathy, unspecified: Secondary | ICD-10-CM | POA: Diagnosis not present

## 2023-12-16 DIAGNOSIS — E785 Hyperlipidemia, unspecified: Secondary | ICD-10-CM | POA: Diagnosis not present

## 2023-12-16 DIAGNOSIS — F331 Major depressive disorder, recurrent, moderate: Secondary | ICD-10-CM | POA: Diagnosis not present

## 2023-12-16 DIAGNOSIS — E1165 Type 2 diabetes mellitus with hyperglycemia: Secondary | ICD-10-CM | POA: Diagnosis not present

## 2023-12-27 DIAGNOSIS — H04223 Epiphora due to insufficient drainage, bilateral lacrimal glands: Secondary | ICD-10-CM | POA: Diagnosis not present

## 2023-12-27 DIAGNOSIS — H00021 Hordeolum internum right upper eyelid: Secondary | ICD-10-CM | POA: Diagnosis not present

## 2023-12-27 DIAGNOSIS — H0288A Meibomian gland dysfunction right eye, upper and lower eyelids: Secondary | ICD-10-CM | POA: Diagnosis not present

## 2023-12-27 DIAGNOSIS — H0288B Meibomian gland dysfunction left eye, upper and lower eyelids: Secondary | ICD-10-CM | POA: Diagnosis not present

## 2023-12-29 DIAGNOSIS — R198 Other specified symptoms and signs involving the digestive system and abdomen: Secondary | ICD-10-CM | POA: Diagnosis not present

## 2023-12-29 DIAGNOSIS — R1032 Left lower quadrant pain: Secondary | ICD-10-CM | POA: Diagnosis not present

## 2023-12-29 DIAGNOSIS — Z9049 Acquired absence of other specified parts of digestive tract: Secondary | ICD-10-CM | POA: Diagnosis not present

## 2023-12-29 DIAGNOSIS — R159 Full incontinence of feces: Secondary | ICD-10-CM | POA: Diagnosis not present

## 2024-01-03 DIAGNOSIS — F331 Major depressive disorder, recurrent, moderate: Secondary | ICD-10-CM | POA: Diagnosis not present

## 2024-01-03 DIAGNOSIS — I10 Essential (primary) hypertension: Secondary | ICD-10-CM | POA: Diagnosis not present

## 2024-01-03 DIAGNOSIS — F515 Nightmare disorder: Secondary | ICD-10-CM | POA: Diagnosis not present

## 2024-01-03 DIAGNOSIS — E785 Hyperlipidemia, unspecified: Secondary | ICD-10-CM | POA: Diagnosis not present

## 2024-01-03 DIAGNOSIS — E1165 Type 2 diabetes mellitus with hyperglycemia: Secondary | ICD-10-CM | POA: Diagnosis not present

## 2024-01-03 DIAGNOSIS — E1140 Type 2 diabetes mellitus with diabetic neuropathy, unspecified: Secondary | ICD-10-CM | POA: Diagnosis not present

## 2024-01-03 DIAGNOSIS — H35033 Hypertensive retinopathy, bilateral: Secondary | ICD-10-CM | POA: Diagnosis not present

## 2024-01-03 DIAGNOSIS — I739 Peripheral vascular disease, unspecified: Secondary | ICD-10-CM | POA: Diagnosis not present

## 2024-01-03 DIAGNOSIS — R195 Other fecal abnormalities: Secondary | ICD-10-CM | POA: Diagnosis not present

## 2024-01-06 DIAGNOSIS — D125 Benign neoplasm of sigmoid colon: Secondary | ICD-10-CM | POA: Diagnosis not present

## 2024-01-06 DIAGNOSIS — R159 Full incontinence of feces: Secondary | ICD-10-CM | POA: Diagnosis not present

## 2024-01-06 DIAGNOSIS — K5730 Diverticulosis of large intestine without perforation or abscess without bleeding: Secondary | ICD-10-CM | POA: Diagnosis not present

## 2024-01-06 DIAGNOSIS — R1032 Left lower quadrant pain: Secondary | ICD-10-CM | POA: Diagnosis not present

## 2024-01-06 DIAGNOSIS — D122 Benign neoplasm of ascending colon: Secondary | ICD-10-CM | POA: Diagnosis not present

## 2024-01-10 DIAGNOSIS — D125 Benign neoplasm of sigmoid colon: Secondary | ICD-10-CM | POA: Diagnosis not present

## 2024-01-10 DIAGNOSIS — D122 Benign neoplasm of ascending colon: Secondary | ICD-10-CM | POA: Diagnosis not present

## 2024-01-15 DIAGNOSIS — E1165 Type 2 diabetes mellitus with hyperglycemia: Secondary | ICD-10-CM | POA: Diagnosis not present

## 2024-01-16 DIAGNOSIS — H35033 Hypertensive retinopathy, bilateral: Secondary | ICD-10-CM | POA: Diagnosis not present

## 2024-01-16 DIAGNOSIS — F331 Major depressive disorder, recurrent, moderate: Secondary | ICD-10-CM | POA: Diagnosis not present

## 2024-01-16 DIAGNOSIS — E785 Hyperlipidemia, unspecified: Secondary | ICD-10-CM | POA: Diagnosis not present

## 2024-01-16 DIAGNOSIS — E1140 Type 2 diabetes mellitus with diabetic neuropathy, unspecified: Secondary | ICD-10-CM | POA: Diagnosis not present

## 2024-01-16 DIAGNOSIS — I739 Peripheral vascular disease, unspecified: Secondary | ICD-10-CM | POA: Diagnosis not present

## 2024-01-16 DIAGNOSIS — E1165 Type 2 diabetes mellitus with hyperglycemia: Secondary | ICD-10-CM | POA: Diagnosis not present

## 2024-02-02 DIAGNOSIS — E1165 Type 2 diabetes mellitus with hyperglycemia: Secondary | ICD-10-CM | POA: Diagnosis not present

## 2024-02-02 DIAGNOSIS — H35033 Hypertensive retinopathy, bilateral: Secondary | ICD-10-CM | POA: Diagnosis not present

## 2024-02-02 DIAGNOSIS — I739 Peripheral vascular disease, unspecified: Secondary | ICD-10-CM | POA: Diagnosis not present

## 2024-02-02 DIAGNOSIS — E1140 Type 2 diabetes mellitus with diabetic neuropathy, unspecified: Secondary | ICD-10-CM | POA: Diagnosis not present

## 2024-02-04 DIAGNOSIS — H43811 Vitreous degeneration, right eye: Secondary | ICD-10-CM | POA: Diagnosis not present

## 2024-02-14 DIAGNOSIS — E1165 Type 2 diabetes mellitus with hyperglycemia: Secondary | ICD-10-CM | POA: Diagnosis not present

## 2024-02-15 DIAGNOSIS — E1165 Type 2 diabetes mellitus with hyperglycemia: Secondary | ICD-10-CM | POA: Diagnosis not present

## 2024-02-15 DIAGNOSIS — E1140 Type 2 diabetes mellitus with diabetic neuropathy, unspecified: Secondary | ICD-10-CM | POA: Diagnosis not present

## 2024-02-15 DIAGNOSIS — H35033 Hypertensive retinopathy, bilateral: Secondary | ICD-10-CM | POA: Diagnosis not present

## 2024-02-15 DIAGNOSIS — I739 Peripheral vascular disease, unspecified: Secondary | ICD-10-CM | POA: Diagnosis not present

## 2024-02-15 DIAGNOSIS — E785 Hyperlipidemia, unspecified: Secondary | ICD-10-CM | POA: Diagnosis not present

## 2024-02-16 DIAGNOSIS — E1165 Type 2 diabetes mellitus with hyperglycemia: Secondary | ICD-10-CM | POA: Diagnosis not present

## 2024-02-16 DIAGNOSIS — Z23 Encounter for immunization: Secondary | ICD-10-CM | POA: Diagnosis not present

## 2024-02-16 DIAGNOSIS — H35033 Hypertensive retinopathy, bilateral: Secondary | ICD-10-CM | POA: Diagnosis not present

## 2024-02-16 DIAGNOSIS — I10 Essential (primary) hypertension: Secondary | ICD-10-CM | POA: Diagnosis not present

## 2024-02-16 DIAGNOSIS — G250 Essential tremor: Secondary | ICD-10-CM | POA: Diagnosis not present

## 2024-03-07 DIAGNOSIS — H43813 Vitreous degeneration, bilateral: Secondary | ICD-10-CM | POA: Diagnosis not present

## 2024-03-07 DIAGNOSIS — H04223 Epiphora due to insufficient drainage, bilateral lacrimal glands: Secondary | ICD-10-CM | POA: Diagnosis not present

## 2024-03-15 DIAGNOSIS — E1165 Type 2 diabetes mellitus with hyperglycemia: Secondary | ICD-10-CM | POA: Diagnosis not present

## 2024-03-22 DIAGNOSIS — R0789 Other chest pain: Secondary | ICD-10-CM | POA: Diagnosis not present

## 2024-03-22 DIAGNOSIS — R450 Nervousness: Secondary | ICD-10-CM | POA: Diagnosis not present

## 2024-03-22 DIAGNOSIS — E1165 Type 2 diabetes mellitus with hyperglycemia: Secondary | ICD-10-CM | POA: Diagnosis not present

## 2024-04-02 DIAGNOSIS — E1165 Type 2 diabetes mellitus with hyperglycemia: Secondary | ICD-10-CM | POA: Diagnosis not present

## 2024-04-02 DIAGNOSIS — E1140 Type 2 diabetes mellitus with diabetic neuropathy, unspecified: Secondary | ICD-10-CM | POA: Diagnosis not present

## 2024-04-02 DIAGNOSIS — H35033 Hypertensive retinopathy, bilateral: Secondary | ICD-10-CM | POA: Diagnosis not present

## 2024-04-02 DIAGNOSIS — I739 Peripheral vascular disease, unspecified: Secondary | ICD-10-CM | POA: Diagnosis not present

## 2024-04-04 DIAGNOSIS — I10 Essential (primary) hypertension: Secondary | ICD-10-CM | POA: Diagnosis not present

## 2024-04-04 DIAGNOSIS — E1165 Type 2 diabetes mellitus with hyperglycemia: Secondary | ICD-10-CM | POA: Diagnosis not present

## 2024-04-04 DIAGNOSIS — F331 Major depressive disorder, recurrent, moderate: Secondary | ICD-10-CM | POA: Diagnosis not present

## 2024-04-14 DIAGNOSIS — E1165 Type 2 diabetes mellitus with hyperglycemia: Secondary | ICD-10-CM | POA: Diagnosis not present

## 2024-04-16 DIAGNOSIS — E785 Hyperlipidemia, unspecified: Secondary | ICD-10-CM | POA: Diagnosis not present

## 2024-04-16 DIAGNOSIS — H35033 Hypertensive retinopathy, bilateral: Secondary | ICD-10-CM | POA: Diagnosis not present

## 2024-04-16 DIAGNOSIS — E1140 Type 2 diabetes mellitus with diabetic neuropathy, unspecified: Secondary | ICD-10-CM | POA: Diagnosis not present

## 2024-04-16 DIAGNOSIS — E1165 Type 2 diabetes mellitus with hyperglycemia: Secondary | ICD-10-CM | POA: Diagnosis not present

## 2024-04-16 DIAGNOSIS — I739 Peripheral vascular disease, unspecified: Secondary | ICD-10-CM | POA: Diagnosis not present

## 2024-04-16 DIAGNOSIS — F331 Major depressive disorder, recurrent, moderate: Secondary | ICD-10-CM | POA: Diagnosis not present

## 2024-04-17 DIAGNOSIS — X32XXXD Exposure to sunlight, subsequent encounter: Secondary | ICD-10-CM | POA: Diagnosis not present

## 2024-04-17 DIAGNOSIS — L570 Actinic keratosis: Secondary | ICD-10-CM | POA: Diagnosis not present

## 2024-04-17 DIAGNOSIS — L728 Other follicular cysts of the skin and subcutaneous tissue: Secondary | ICD-10-CM | POA: Diagnosis not present

## 2024-06-01 ENCOUNTER — Other Ambulatory Visit: Payer: Self-pay | Admitting: Family Medicine

## 2024-06-01 DIAGNOSIS — R519 Headache, unspecified: Secondary | ICD-10-CM

## 2024-06-01 DIAGNOSIS — R29818 Other symptoms and signs involving the nervous system: Secondary | ICD-10-CM

## 2024-06-03 ENCOUNTER — Ambulatory Visit
Admission: RE | Admit: 2024-06-03 | Discharge: 2024-06-03 | Disposition: A | Source: Ambulatory Visit | Attending: Family Medicine | Admitting: Family Medicine

## 2024-06-03 DIAGNOSIS — R519 Headache, unspecified: Secondary | ICD-10-CM

## 2024-06-03 DIAGNOSIS — R29818 Other symptoms and signs involving the nervous system: Secondary | ICD-10-CM

## 2024-06-03 MED ORDER — GADOPICLENOL 0.5 MMOL/ML IV SOLN
7.5000 mL | Freq: Once | INTRAVENOUS | Status: AC | PRN
  Administered 2024-06-03: 7.5 mL via INTRAVENOUS

## 2024-08-17 ENCOUNTER — Ambulatory Visit: Admitting: Neurology

## 2024-08-22 ENCOUNTER — Ambulatory Visit: Admitting: Neurology
